# Patient Record
Sex: Female | Born: 1955 | Race: White | Hispanic: No | Marital: Married | Smoking: Never smoker
Health system: Southern US, Community
[De-identification: ages and names within clinical notes are randomized; demographics above are authoritative.]

## PROBLEM LIST (undated history)

## (undated) DIAGNOSIS — E7800 Pure hypercholesterolemia, unspecified: Secondary | ICD-10-CM

## (undated) DIAGNOSIS — L680 Hirsutism: Secondary | ICD-10-CM

## (undated) DIAGNOSIS — H269 Unspecified cataract: Secondary | ICD-10-CM

## (undated) DIAGNOSIS — D649 Anemia, unspecified: Secondary | ICD-10-CM

## (undated) DIAGNOSIS — M810 Age-related osteoporosis without current pathological fracture: Secondary | ICD-10-CM

## (undated) DIAGNOSIS — M1990 Unspecified osteoarthritis, unspecified site: Secondary | ICD-10-CM

## (undated) DIAGNOSIS — C9110 Chronic lymphocytic leukemia of B-cell type not having achieved remission: Secondary | ICD-10-CM

## (undated) DIAGNOSIS — E079 Disorder of thyroid, unspecified: Secondary | ICD-10-CM

## (undated) HISTORY — DX: Unspecified cataract: H26.9

## (undated) HISTORY — DX: Disorder of thyroid, unspecified: E07.9

## (undated) HISTORY — DX: Age-related osteoporosis without current pathological fracture: M81.0

## (undated) HISTORY — DX: Hirsutism: L68.0

## (undated) HISTORY — DX: Unspecified osteoarthritis, unspecified site: M19.90

## (undated) HISTORY — DX: Anemia, unspecified: D64.9

## (undated) HISTORY — DX: Chronic lymphocytic leukemia of B-cell type not having achieved remission: C91.10

## (undated) HISTORY — DX: Pure hypercholesterolemia, unspecified: E78.00

## (undated) HISTORY — PX: CATARACT EXTRACTION, BILATERAL: SHX1313

---
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
## 2003-09-13 ENCOUNTER — Other Ambulatory Visit: Admission: RE | Admit: 2003-09-13 | Discharge: 2003-09-13 | Payer: Self-pay | Admitting: Obstetrics & Gynecology

## 2004-04-04 ENCOUNTER — Encounter: Admission: RE | Admit: 2004-04-04 | Discharge: 2004-04-04 | Payer: Self-pay | Admitting: Obstetrics & Gynecology

## 2004-10-31 ENCOUNTER — Ambulatory Visit (HOSPITAL_COMMUNITY): Admission: RE | Admit: 2004-10-31 | Discharge: 2004-10-31 | Payer: Self-pay | Admitting: Gastroenterology

## 2005-09-18 ENCOUNTER — Ambulatory Visit: Payer: Self-pay | Admitting: Internal Medicine

## 2005-11-27 ENCOUNTER — Ambulatory Visit (HOSPITAL_COMMUNITY): Admission: RE | Admit: 2005-11-27 | Discharge: 2005-11-27 | Payer: Self-pay | Admitting: Cardiology

## 2013-02-03 ENCOUNTER — Encounter: Payer: Self-pay | Admitting: Obstetrics & Gynecology

## 2014-08-09 ENCOUNTER — Encounter: Payer: Self-pay | Admitting: Obstetrics & Gynecology

## 2014-11-26 ENCOUNTER — Other Ambulatory Visit: Payer: Self-pay | Admitting: Family Medicine

## 2014-11-26 ENCOUNTER — Ambulatory Visit
Admission: RE | Admit: 2014-11-26 | Discharge: 2014-11-26 | Disposition: A | Discharge status: Home / Self Care | Payer: Self-pay | Source: Ambulatory Visit | Attending: Family Medicine | Admitting: Family Medicine

## 2014-11-26 DIAGNOSIS — S139XXA Sprain of joints and ligaments of unspecified parts of neck, initial encounter: Secondary | ICD-10-CM

## 2015-10-16 ENCOUNTER — Encounter: Payer: Self-pay | Admitting: Obstetrics & Gynecology

## 2015-12-05 HISTORY — PX: BASAL CELL CARCINOMA EXCISION: SHX1214

## 2016-10-14 ENCOUNTER — Other Ambulatory Visit: Payer: Self-pay | Admitting: Obstetrics & Gynecology

## 2016-10-14 ENCOUNTER — Ambulatory Visit (INDEPENDENT_AMBULATORY_CARE_PROVIDER_SITE_OTHER): Payer: 59 | Admitting: Obstetrics & Gynecology

## 2016-10-14 ENCOUNTER — Encounter: Payer: Self-pay | Admitting: Obstetrics & Gynecology

## 2016-10-14 VITALS — BP 112/73 | Ht 60.0 in | Wt 103.0 lb

## 2016-10-14 DIAGNOSIS — Z01411 Encounter for gynecological examination (general) (routine) with abnormal findings: Secondary | ICD-10-CM | POA: Diagnosis not present

## 2016-10-14 DIAGNOSIS — Z780 Asymptomatic menopausal state: Secondary | ICD-10-CM

## 2016-10-14 DIAGNOSIS — Z1151 Encounter for screening for human papillomavirus (HPV): Secondary | ICD-10-CM | POA: Diagnosis not present

## 2016-10-14 DIAGNOSIS — N952 Postmenopausal atrophic vaginitis: Secondary | ICD-10-CM

## 2016-10-14 DIAGNOSIS — Z01419 Encounter for gynecological examination (general) (routine) without abnormal findings: Secondary | ICD-10-CM

## 2016-10-14 DIAGNOSIS — Z1231 Encounter for screening mammogram for malignant neoplasm of breast: Secondary | ICD-10-CM | POA: Diagnosis not present

## 2016-10-14 NOTE — Progress Notes (Signed)
Rebecca Aguirre [birth year] [id number]   History:    61 y.o.  for annual gyn exam, established patient G2P2 married.  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  Menopause.  No HRT.  Marital counseling with church.  Vaginal dryness and pain with IC, but declines Estradiol creams.  No pelvic pain.  No PMB.  Breasts wnl.  No SUI.  BMs wnl.  Fam MD for labs, but d/ced Crestor 6 mths ago and would like to come back here for a FLP.  Past medical history,surgical history, family history and social history were all reviewed and documented in the EPIC chart.  Gynecologic History No LMP recorded. Patient is postmenopausal. Contraception: post menopausal status Last Pap: 08/2014. Results were: normal/HPV HR neg Last mammogram: 10/2015. Results were: normal Last Dexa 02/2013 Osteopenia  Obstetric History OB History  Gravida Para Term Preterm AB Living            2  SAB TAB Ectopic Multiple Live Births                    ROS: A ROS was performed and pertinent positives and negatives are included in the history.  GENERAL: No fevers or chills. HEENT: No change in vision, no earache, sore throat or sinus congestion. NECK: No pain or stiffness. CARDIOVASCULAR: No chest pain or pressure. No palpitations. PULMONARY: No shortness of breath, cough or wheeze. GASTROINTESTINAL: No abdominal pain, nausea, vomiting or diarrhea, melena or bright red blood per rectum. GENITOURINARY: No urinary frequency, urgency, hesitancy or dysuria. MUSCULOSKELETAL: No joint or muscle pain, no back pain, no recent trauma. DERMATOLOGIC: No rash, no itching, no lesions. ENDOCRINE: No polyuria, polydipsia, no heat or cold intolerance. No recent change in weight. HEMATOLOGICAL: No anemia or easy bruising or bleeding. NEUROLOGIC: No headache, seizures, numbness, tingling or weakness. PSYCHIATRIC: No depression, no loss of interest in normal activity or change in sleep pattern.     Exam:   BP 112/73   Ht 5' (1.524 m)    Wt 103 lb (46.7 kg)   BMI 20.12 kg/m   Body mass index is 20.12 kg/m.  General appearance : Well developed well nourished female. No acute distress HEENT: Eyes: no retinal hemorrhage or exudates,  Neck supple, trachea midline, no carotid bruits, no thyroidmegaly Lungs: Clear to auscultation, no rhonchi or wheezes, or rib retractions  Heart: Regular rate and rhythm, no murmurs or gallops Breast:Examined in sitting and supine position were symmetrical in appearance, no palpable masses or tenderness,  no skin retraction, no nipple inversion, no nipple discharge, no skin discoloration, no axillary or supraclavicular lymphadenopathy Abdomen: no palpable masses or tenderness, no rebound or guarding Extremities: no edema or skin discoloration or tenderness  Pelvic:  Bartholin, Urethra, Skene Glands: Within normal limits             Vagina: No gross lesions or discharge  Cervix: No gross lesions or discharge.  Pap test done.  Uterus  AV, normal size, shape and consistency, non-tender and mobile  Adnexa  Without masses or tenderness  Anus and perineum  normal      Assessment/Plan:  61 y.o. female for annual exam.  1. Encounter for gynecological examination (general) (routine) with abnormal findings Pap test/HPV HR pending.  Screening Mammo scheduled today.  Will f/u here for Dexa.    2. Postmenopausal atrophic vaginitis Declines Estradiol cream.  Will use Replens PRN.  Kelly gel for IC.  3. Hypercholesterolemia D/Ced Crestor 6 mths ago.  Low cholesterol diet and physical activity.  F/U FLP.  Counseling >50% 15 min on above problems.   Princess Bruins MD, 11:06 AM 10/14/2016

## 2016-10-14 NOTE — Patient Instructions (Signed)
Annual exam/gyn exam with atrophic vaginitis.  Replens/KY gel.  Pap/HPV HR pending.  Will f/u for fasting Cholesterol panel.  Schedule Bone Density.

## 2016-10-14 NOTE — Addendum Note (Signed)
Addended by: Thamas Jaegers on: 10/14/2016 11:25 AM   Modules accepted: Orders

## 2016-10-16 LAB — PAP, TP IMAGING W/ HPV RNA, RFLX HPV TYPE 16,18/45: HPV mRNA, High Risk: NOT DETECTED

## 2016-10-17 ENCOUNTER — Encounter: Payer: Self-pay | Admitting: Obstetrics & Gynecology

## 2016-10-20 ENCOUNTER — Other Ambulatory Visit: Payer: Self-pay | Admitting: Gynecology

## 2016-10-20 ENCOUNTER — Encounter: Payer: Self-pay | Admitting: Gynecology

## 2016-10-20 ENCOUNTER — Ambulatory Visit (INDEPENDENT_AMBULATORY_CARE_PROVIDER_SITE_OTHER): Payer: 59

## 2016-10-20 ENCOUNTER — Telehealth: Payer: Self-pay | Admitting: Gynecology

## 2016-10-20 DIAGNOSIS — M810 Age-related osteoporosis without current pathological fracture: Secondary | ICD-10-CM

## 2016-10-20 DIAGNOSIS — Z780 Asymptomatic menopausal state: Secondary | ICD-10-CM

## 2016-10-20 HISTORY — DX: Age-related osteoporosis without current pathological fracture: M81.0

## 2016-10-20 NOTE — Telephone Encounter (Signed)
Agree, please schedule patient with me to discuss and manage Osteoporosis.

## 2016-10-20 NOTE — Telephone Encounter (Signed)
Tell patient her most recent bone density shows osteoporosis. Recommend office visit with Dr Lavoie to discuss treatment options 

## 2016-10-21 NOTE — Telephone Encounter (Signed)
Pt informed pt will call back to schedule OV.

## 2016-10-29 ENCOUNTER — Encounter: Payer: Self-pay | Admitting: Obstetrics & Gynecology

## 2016-10-29 ENCOUNTER — Telehealth: Payer: Self-pay

## 2016-10-29 ENCOUNTER — Ambulatory Visit (INDEPENDENT_AMBULATORY_CARE_PROVIDER_SITE_OTHER): Payer: 59 | Admitting: Obstetrics & Gynecology

## 2016-10-29 VITALS — BP 108/68

## 2016-10-29 DIAGNOSIS — M810 Age-related osteoporosis without current pathological fracture: Secondary | ICD-10-CM

## 2016-10-29 MED ORDER — ALENDRONATE SODIUM 70 MG PO TABS: 70.0000 mg | ORAL_TABLET | ORAL | 4 refills | Status: DC

## 2016-10-29 NOTE — Telephone Encounter (Signed)
Thanks, Alendronate (Fosamax) prescription sent.

## 2016-10-29 NOTE — Patient Instructions (Addendum)
Visit to discuss and manage your Bone Density results showing Osteoporosis.  The Bone Density showed the most severe bone loss at the Lt Femoral neck with a T score of -2.8 (Rt Femoral neck T score -2.5), other sites were in Osteopenia range.  After discussing Vit D and Ca++ supplementation as well at weight bearing physical activity, we decided to start on Alendronate (Fosamax generic) 70 mg PO every week to help maintain Bone Mass and decrease the long term risk of fracture which is currently increased by Osteoporosis.

## 2016-10-29 NOTE — Progress Notes (Signed)
    Rebecca Aguirre 1956-03-02 [id number]        61 y.o.  G2P2 married.  Menopause.  No HRT.  Consultation on management of BD result showing Osteroporosis  Personal h/o Osteopenia.  BD 2011 reviewed. No h/o fracture.  Had stopped Vit D and Ca++ supplements.  Physically active.  Never on Biphosphanates or other "Bone" treatment.  Mother on Prolia for Osteoporosis.    Past medical history,surgical history, problem list, medications, allergies, family history and social history were all reviewed and documented in the EPIC chart.  Directed ROS with pertinent positives and negatives documented in the history of present illness/assessment and plan.  Exam:  Vitals:   10/29/16 1032  BP: 108/68   General appearance:  Normal  BD result:  Osteoporosis at Femoral Neck Bilaterally.  Assessment/Plan:  61 y.o.   1. Age-related osteoporosis without current pathological fracture Osteoporosis.  T score -2.8 at Lt Femur neck (most severe location, Rt Femur neck T score -2.5), Osteopenia only at Spine.  The risk and benefits of Alendronate were discussed with the patient today to include a significant risk reduction for fractures. The patient was informed that stabilization of Ostoporosis, prevention of progression, not normalization of Bone Mass is the goal.  The most common side effects that have been reported include: Dizziness, and leg cramps although generally not severe enough to warrant discontinuation in the majority of patient on this medication. The small risk of jaw necrosis was discussed.  Alendronate (Fosamax generic) prescribed.  Counseling 100% x 25 min on above issue.   Princess Bruins MD, 11:05 AM 10/29/2016

## 2016-10-29 NOTE — Telephone Encounter (Signed)
Patient said she saw you today and was to call you back with information about where to send her generic Fosamax.  She wants it to go to [facility].  I have put this pharmacy on her chart.

## 2016-11-16 ENCOUNTER — Ambulatory Visit (INDEPENDENT_AMBULATORY_CARE_PROVIDER_SITE_OTHER): Payer: 59 | Admitting: Obstetrics & Gynecology

## 2016-11-16 ENCOUNTER — Encounter: Payer: Self-pay | Admitting: Obstetrics & Gynecology

## 2016-11-16 VITALS — BP 128/74

## 2016-11-16 DIAGNOSIS — N898 Other specified noninflammatory disorders of vagina: Secondary | ICD-10-CM | POA: Diagnosis not present

## 2016-11-16 LAB — WET PREP FOR TRICH, YEAST, CLUE
Clue Cells Wet Prep HPF POC: NONE SEEN
Trich, Wet Prep: NONE SEEN
Yeast Wet Prep HPF POC: NONE SEEN

## 2016-11-16 MED ORDER — TINIDAZOLE 500 MG PO TABS: 2.0000 g | ORAL_TABLET | Freq: Every day | ORAL | 0 refills | Status: AC

## 2016-11-16 NOTE — Patient Instructions (Signed)
1. Vaginal discharge Wet prep showing WBCs and Bacteria.  No Yeast, no Clue cell, no Trichomonas.  Decision to treat with Tindamax.  If no improvement will f/u to reassess. - WET PREP FOR TRICH, YEAST, CLUE  2. Vaginal odor See above. - WET PREP FOR [place], YEAST, CLUE  Please call me for reassessment if no improvement within a week.

## 2016-11-16 NOTE — Progress Notes (Signed)
    Rebecca Aguirre 01-Nov-1955 [id number]        61 y.o.  G2P2 married  RP:  Worsening vaginal d/c x 1-2 wks  Now vaginal d/c is yellowish with odor.  No PMB.  No pelvic pain.  No fever.  No vaginal itching.  Inflammation on Pap 10/2016, otherwise Pap normal, HPV HR neg.  Past medical history,surgical history, problem list, medications, allergies, family history and social history were all reviewed and documented in the EPIC chart.  Directed ROS with pertinent positives and negatives documented in the history of present illness/assessment and plan.  Exam:  Vitals:   11/16/16 1030  BP: 128/74   General appearance:  Normal  Gyn exam:  Vulva normal                     Speculum:  Cervix and vagina normal except for increased yellowish liquid d/c.  Wet prep done.  Assessment/Plan:  61 y.o. No obstetric history on file.   1. Vaginal discharge Wet prep showing WBCs and Bacteria.  No Yeast, no Clue cell, no Trichomonas.  Decision to treat with Tindamax.  If no improvement will f/u to reassess. - WET PREP FOR TRICH, YEAST, CLUE  2. Vaginal odor See above. - WET PREP FOR TRICH, YEAST, CLUE  Counseling on above issue >50% x 15 minutes.  Princess Bruins MD, 10:48 AM 11/16/2016

## 2016-12-08 DIAGNOSIS — H35372 Puckering of macula, left eye: Secondary | ICD-10-CM | POA: Diagnosis not present

## 2016-12-08 DIAGNOSIS — H01003 Unspecified blepharitis right eye, unspecified eyelid: Secondary | ICD-10-CM | POA: Diagnosis not present

## 2016-12-08 DIAGNOSIS — H25013 Cortical age-related cataract, bilateral: Secondary | ICD-10-CM | POA: Diagnosis not present

## 2017-01-14 DIAGNOSIS — L821 Other seborrheic keratosis: Secondary | ICD-10-CM | POA: Diagnosis not present

## 2017-01-14 DIAGNOSIS — D225 Melanocytic nevi of trunk: Secondary | ICD-10-CM | POA: Diagnosis not present

## 2017-01-14 DIAGNOSIS — L814 Other melanin hyperpigmentation: Secondary | ICD-10-CM | POA: Diagnosis not present

## 2017-05-13 DIAGNOSIS — M24571 Contracture, right ankle: Secondary | ICD-10-CM | POA: Diagnosis not present

## 2017-05-13 DIAGNOSIS — M7751 Other enthesopathy of right foot: Secondary | ICD-10-CM | POA: Diagnosis not present

## 2017-05-13 DIAGNOSIS — M79671 Pain in right foot: Secondary | ICD-10-CM | POA: Diagnosis not present

## 2017-06-02 DIAGNOSIS — M7751 Other enthesopathy of right foot: Secondary | ICD-10-CM | POA: Diagnosis not present

## 2017-06-02 DIAGNOSIS — M79671 Pain in right foot: Secondary | ICD-10-CM | POA: Diagnosis not present

## 2017-06-02 DIAGNOSIS — M24571 Contracture, right ankle: Secondary | ICD-10-CM | POA: Diagnosis not present

## 2017-10-19 ENCOUNTER — Encounter: Payer: 59 | Admitting: Obstetrics & Gynecology

## 2017-10-19 DIAGNOSIS — Z1231 Encounter for screening mammogram for malignant neoplasm of breast: Secondary | ICD-10-CM | POA: Diagnosis not present

## 2017-10-26 ENCOUNTER — Ambulatory Visit (INDEPENDENT_AMBULATORY_CARE_PROVIDER_SITE_OTHER): Payer: 59 | Admitting: Obstetrics & Gynecology

## 2017-10-26 ENCOUNTER — Encounter: Payer: Self-pay | Admitting: Obstetrics & Gynecology

## 2017-10-26 VITALS — BP 126/80 | Ht 60.0 in | Wt 104.0 lb

## 2017-10-26 DIAGNOSIS — N952 Postmenopausal atrophic vaginitis: Secondary | ICD-10-CM | POA: Diagnosis not present

## 2017-10-26 DIAGNOSIS — Z01411 Encounter for gynecological examination (general) (routine) with abnormal findings: Secondary | ICD-10-CM | POA: Diagnosis not present

## 2017-10-26 DIAGNOSIS — M810 Age-related osteoporosis without current pathological fracture: Secondary | ICD-10-CM | POA: Diagnosis not present

## 2017-10-26 DIAGNOSIS — Z780 Asymptomatic menopausal state: Secondary | ICD-10-CM

## 2017-10-26 LAB — CBC
HCT: 43.9 % (ref 35.0–45.0)
Hemoglobin: 15.2 g/dL (ref 11.7–15.5)
MCH: 30.2 pg (ref 27.0–33.0)
MCHC: 34.6 g/dL (ref 32.0–36.0)
MCV: 87.1 fL (ref 80.0–100.0)
MPV: 10.4 fL (ref 7.5–12.5)
PLATELETS: 227 10*3/uL (ref 140–400)
RBC: 5.04 10*6/uL (ref 3.80–5.10)
RDW: 13 % (ref 11.0–15.0)
WBC: 8.4 10*3/uL (ref 3.8–10.8)

## 2017-10-26 LAB — COMPREHENSIVE METABOLIC PANEL
AG Ratio: 2 (calc) (ref 1.0–2.5)
ALBUMIN MSPROF: 4.5 g/dL (ref 3.6–5.1)
ALT: 10 U/L (ref 6–29)
AST: 19 U/L (ref 10–35)
Alkaline phosphatase (APISO): 48 U/L (ref 33–130)
BUN: 13 mg/dL (ref 7–25)
CHLORIDE: 101 mmol/L (ref 98–110)
CO2: 30 mmol/L (ref 20–32)
CREATININE: 0.83 mg/dL (ref 0.50–0.99)
Calcium: 9.6 mg/dL (ref 8.6–10.4)
GLOBULIN: 2.2 g/dL (ref 1.9–3.7)
GLUCOSE: 90 mg/dL (ref 65–99)
Potassium: 3.8 mmol/L (ref 3.5–5.3)
Sodium: 140 mmol/L (ref 135–146)
Total Bilirubin: 0.8 mg/dL (ref 0.2–1.2)
Total Protein: 6.7 g/dL (ref 6.1–8.1)

## 2017-10-26 LAB — TSH: TSH: 2.31 mIU/L (ref 0.40–4.50)

## 2017-10-26 LAB — LIPID PANEL
CHOL/HDL RATIO: 4.7 (calc) (ref <5.0)
Cholesterol: 260 mg/dL — ABNORMAL HIGH (ref <200)
HDL: 55 mg/dL (ref >50)
LDL Cholesterol (Calc): 175 mg/dL (calc) — ABNORMAL HIGH
NON-HDL CHOLESTEROL (CALC): 205 mg/dL — AB (ref <130)
TRIGLYCERIDES: 157 mg/dL — AB (ref <150)

## 2017-10-26 NOTE — Progress Notes (Signed)
Rebecca Aguirre [birth year] [id number]   History:    62 y.o. G2P2L2 Married.    RP:  Established patient presenting for annual gyn exam   HPI: Menopause, well without hormone replacement therapy.  No postmenopausal bleeding.  Has not been sexually active lately because of pain at the entrance of the vagina on the left side.  Has not used the Premarin cream prescribed.  Would prefer bioidentical estrogen.  No pain when not sexually active.  No vaginal discharge.  Urine and bowel movements normal.  Breasts normal.  Fasting Health labs here today.  Body mass index 20.31.  Physically active regularly.  Past medical history,surgical history, family history and social history were all reviewed and documented in the EPIC chart.  Gynecologic History No LMP recorded. Patient is postmenopausal. Contraception: post menopausal status Last Pap: 10/2016. Results were: Negative Last mammogram: 10/2017. Results were: Negative Bone Density: 10/2016.  Osteoporosis at the left femoral neck with a T score of -2.8.  Declines treatment at this time. Colonoscopy: 10 yrs ago, will schedule this year.  Obstetric History OB History  Gravida Para Term Preterm AB Living  2       0 2  SAB TAB Ectopic Multiple Live Births      0        # Outcome Date GA Lbr Len/2nd Weight Sex Delivery Anes PTL Lv  2 Gravida           1 Gravida              ROS: A ROS was performed and pertinent positives and negatives are included in the history.  GENERAL: No fevers or chills. HEENT: No change in vision, no earache, sore throat or sinus congestion. NECK: No pain or stiffness. CARDIOVASCULAR: No chest pain or pressure. No palpitations. PULMONARY: No shortness of breath, cough or wheeze. GASTROINTESTINAL: No abdominal pain, nausea, vomiting or diarrhea, melena or bright red blood per rectum. GENITOURINARY: No urinary frequency, urgency, hesitancy or dysuria. MUSCULOSKELETAL: No joint or muscle pain, no back pain, no recent trauma.  DERMATOLOGIC: No rash, no itching, no lesions. ENDOCRINE: No polyuria, polydipsia, no heat or cold intolerance. No recent change in weight. HEMATOLOGICAL: No anemia or easy bruising or bleeding. NEUROLOGIC: No headache, seizures, numbness, tingling or weakness. PSYCHIATRIC: No depression, no loss of interest in normal activity or change in sleep pattern.     Exam:   BP 126/80 (BP Location: Right Arm, Patient Position: Sitting, Cuff Size: Normal)   Ht 5' (1.524 m)   Wt 104 lb (47.2 kg)   BMI 20.31 kg/m   Body mass index is 20.31 kg/m.  General appearance : Well developed well nourished female. No acute distress HEENT: Eyes: no retinal hemorrhage or exudates,  Neck supple, trachea midline, no carotid bruits, no thyroidmegaly Lungs: Clear to auscultation, no rhonchi or wheezes, or rib retractions  Heart: Regular rate and rhythm, no murmurs or gallops Breast:Examined in sitting and supine position were symmetrical in appearance, no palpable masses or tenderness,  no skin retraction, no nipple inversion, no nipple discharge, no skin discoloration, no axillary or supraclavicular lymphadenopathy Abdomen: no palpable masses or tenderness, no rebound or guarding Extremities: no edema or skin discoloration or tenderness  Pelvic: Vulva: Atrophic vaginitis of menopause.  No discrete lesion.             Vagina: No gross lesions or discharge  Cervix: No gross lesions or discharge  Uterus  AV, normal size, shape and consistency, non-tender and  mobile  Adnexa  Without masses or tenderness  Anus: Normal   Assessment/Plan:  62 y.o. female for annual exam   1. Encounter for gynecological examination with abnormal finding Gynecologic exam with postmenopausal atrophic vaginitis.  Pap test negative April 2018.  Breast exam normal.  Screening mammogram -April 2019.  Will schedule colonoscopy this year.  Health labs here today. - CBC - Comp Met (CMET) - Lipid panel - TSH - VITAMIN D 25 Hydroxy (Vit-D  Deficiency, Fractures)  2. Menopause present Well on no hormone replacement therapy.  No postmenopausal bleeding.  3. Post-menopausal atrophic vaginitis Postmenopausal atrophic vaginitis with superficial dyspareunia.  Recommended Estrace cream.  Patient prefers to use coconut oil at this time and will call back if needs the estradiol cream treatment.  4. Age-related osteoporosis without current pathological fracture Osteoporosis with a T score of -2.8.  Patient declined treatment after counseling.  Patient on vitamin D supplements, calcium rich nutrition and regular weightbearing physical activity.  Will repeat bone density in April 2020.  Other orders - Multiple Vitamin (MULTIVITAMIN) capsule; Take 1 capsule by mouth. occasional - Cholecalciferol (VITAMIN D PO); Take by mouth. occasional - CALCIUM PO; Take by mouth. occasional  Princess Bruins MD, 12:55 PM 10/26/2017

## 2017-10-27 ENCOUNTER — Encounter: Payer: Self-pay | Admitting: Obstetrics & Gynecology

## 2017-10-27 LAB — VITAMIN D 25 HYDROXY (VIT D DEFICIENCY, FRACTURES): Vit D, 25-Hydroxy: 17 ng/mL — ABNORMAL LOW (ref 30–100)

## 2017-10-27 NOTE — Patient Instructions (Signed)
1. Encounter for gynecological examination with abnormal finding Gynecologic exam with postmenopausal atrophic vaginitis.  Pap test negative April 2018.  Breast exam normal.  Screening mammogram -April 2019.  Will schedule colonoscopy this year.  Health labs here today. - CBC - Comp Met (CMET) - Lipid panel - TSH - VITAMIN D 25 Hydroxy (Vit-D Deficiency, Fractures)  2. Menopause present Well on no hormone replacement therapy.  No postmenopausal bleeding.  3. Post-menopausal atrophic vaginitis Postmenopausal atrophic vaginitis with superficial dyspareunia.  Recommended Estrace cream.  Patient prefers to use coconut oil at this time and will call back if needs the estradiol cream treatment.  4. Age-related osteoporosis without current pathological fracture Osteoporosis with a T score of -2.8.  Patient declined treatment after counseling.  Patient on vitamin D supplements, calcium rich nutrition and regular weightbearing physical activity.  Will repeat bone density in April 2020.  Other orders - Multiple Vitamin (MULTIVITAMIN) capsule; Take 1 capsule by mouth. occasional - Cholecalciferol (VITAMIN D PO); Take by mouth. occasional - CALCIUM PO; Take by mouth. occasional  Joycelyn Schmid, good seeing you today!

## 2017-10-28 ENCOUNTER — Other Ambulatory Visit: Payer: Self-pay | Admitting: Obstetrics & Gynecology

## 2017-10-28 DIAGNOSIS — E559 Vitamin D deficiency, unspecified: Secondary | ICD-10-CM

## 2017-10-28 MED ORDER — VITAMIN D (ERGOCALCIFEROL) 1.25 MG (50000 UNIT) PO CAPS: 50000.0000 [IU] | ORAL_CAPSULE | ORAL | 0 refills | Status: DC

## 2017-10-29 ENCOUNTER — Telehealth: Payer: Self-pay | Admitting: *Deleted

## 2017-10-29 NOTE — Telephone Encounter (Signed)
Patient called asking how much calcium she should be taking daily? Please advise

## 2017-11-01 NOTE — Telephone Encounter (Signed)
I don't recommend Ca++ supplements, rather I would like her to eat Ca++ rich foods, like milk, cheese...  If she cannot, than Ca++ Supplement 500 or 550 mg per day would be safe.

## 2017-11-01 NOTE — Telephone Encounter (Signed)
Pt informed with the below note. 

## 2017-11-02 ENCOUNTER — Encounter: Payer: Self-pay | Admitting: Anesthesiology

## 2018-01-31 ENCOUNTER — Other Ambulatory Visit: Payer: 59

## 2018-02-01 ENCOUNTER — Other Ambulatory Visit: Payer: 59

## 2018-02-01 DIAGNOSIS — E559 Vitamin D deficiency, unspecified: Secondary | ICD-10-CM

## 2018-02-02 LAB — VITAMIN D 25 HYDROXY (VIT D DEFICIENCY, FRACTURES): Vit D, 25-Hydroxy: 31 ng/mL (ref 30–100)

## 2018-02-11 DIAGNOSIS — H01003 Unspecified blepharitis right eye, unspecified eyelid: Secondary | ICD-10-CM | POA: Diagnosis not present

## 2018-02-11 DIAGNOSIS — H25013 Cortical age-related cataract, bilateral: Secondary | ICD-10-CM | POA: Diagnosis not present

## 2018-02-11 DIAGNOSIS — H35372 Puckering of macula, left eye: Secondary | ICD-10-CM | POA: Diagnosis not present

## 2018-03-15 DIAGNOSIS — H6991 Unspecified Eustachian tube disorder, right ear: Secondary | ICD-10-CM | POA: Diagnosis not present

## 2018-03-15 DIAGNOSIS — L02411 Cutaneous abscess of right axilla: Secondary | ICD-10-CM | POA: Diagnosis not present

## 2018-03-23 DIAGNOSIS — R2230 Localized swelling, mass and lump, unspecified upper limb: Secondary | ICD-10-CM | POA: Diagnosis not present

## 2018-03-29 DIAGNOSIS — L720 Epidermal cyst: Secondary | ICD-10-CM | POA: Diagnosis not present

## 2018-03-29 DIAGNOSIS — L814 Other melanin hyperpigmentation: Secondary | ICD-10-CM | POA: Diagnosis not present

## 2018-03-29 DIAGNOSIS — D225 Melanocytic nevi of trunk: Secondary | ICD-10-CM | POA: Diagnosis not present

## 2018-09-27 DIAGNOSIS — R319 Hematuria, unspecified: Secondary | ICD-10-CM | POA: Diagnosis not present

## 2018-09-27 DIAGNOSIS — R1011 Right upper quadrant pain: Secondary | ICD-10-CM | POA: Diagnosis not present

## 2018-09-30 ENCOUNTER — Ambulatory Visit
Admission: RE | Admit: 2018-09-30 | Discharge: 2018-09-30 | Disposition: A | Discharge status: Home / Self Care | Payer: 59 | Source: Ambulatory Visit | Attending: Family Medicine | Admitting: Family Medicine

## 2018-09-30 ENCOUNTER — Other Ambulatory Visit: Payer: Self-pay | Admitting: Family Medicine

## 2018-09-30 DIAGNOSIS — R1011 Right upper quadrant pain: Secondary | ICD-10-CM

## 2018-09-30 DIAGNOSIS — R110 Nausea: Secondary | ICD-10-CM

## 2018-10-04 ENCOUNTER — Other Ambulatory Visit: Payer: Self-pay | Admitting: Family Medicine

## 2018-10-04 DIAGNOSIS — R109 Unspecified abdominal pain: Secondary | ICD-10-CM

## 2018-10-04 DIAGNOSIS — K769 Liver disease, unspecified: Secondary | ICD-10-CM

## 2018-10-19 ENCOUNTER — Other Ambulatory Visit: Payer: Self-pay

## 2018-10-19 ENCOUNTER — Ambulatory Visit
Admission: RE | Admit: 2018-10-19 | Discharge: 2018-10-19 | Disposition: A | Discharge status: Home / Self Care | Payer: 59 | Source: Ambulatory Visit | Attending: Family Medicine | Admitting: Family Medicine

## 2018-10-19 DIAGNOSIS — R109 Unspecified abdominal pain: Secondary | ICD-10-CM | POA: Diagnosis not present

## 2018-10-19 DIAGNOSIS — K769 Liver disease, unspecified: Secondary | ICD-10-CM

## 2018-10-28 ENCOUNTER — Encounter: Payer: 59 | Admitting: Obstetrics & Gynecology

## 2018-12-12 ENCOUNTER — Encounter: Payer: Self-pay | Admitting: Obstetrics & Gynecology

## 2019-04-04 ENCOUNTER — Encounter: Payer: Self-pay | Admitting: Gynecology

## 2019-08-31 DIAGNOSIS — Z23 Encounter for immunization: Secondary | ICD-10-CM | POA: Diagnosis not present

## 2019-09-28 DIAGNOSIS — Z23 Encounter for immunization: Secondary | ICD-10-CM | POA: Diagnosis not present

## 2019-10-18 ENCOUNTER — Other Ambulatory Visit: Payer: Self-pay | Admitting: Family Medicine

## 2019-10-18 DIAGNOSIS — K769 Liver disease, unspecified: Secondary | ICD-10-CM

## 2019-10-23 ENCOUNTER — Other Ambulatory Visit: Payer: 59

## 2019-10-31 ENCOUNTER — Ambulatory Visit
Admission: RE | Admit: 2019-10-31 | Discharge: 2019-10-31 | Disposition: A | Discharge status: Home / Self Care | Payer: Medicare HMO | Source: Ambulatory Visit | Attending: Family Medicine | Admitting: Family Medicine

## 2019-10-31 DIAGNOSIS — K769 Liver disease, unspecified: Secondary | ICD-10-CM | POA: Diagnosis not present

## 2019-11-07 DIAGNOSIS — H9312 Tinnitus, left ear: Secondary | ICD-10-CM | POA: Diagnosis not present

## 2019-11-07 DIAGNOSIS — E7800 Pure hypercholesterolemia, unspecified: Secondary | ICD-10-CM | POA: Diagnosis not present

## 2019-11-07 DIAGNOSIS — E559 Vitamin D deficiency, unspecified: Secondary | ICD-10-CM | POA: Diagnosis not present

## 2019-11-07 DIAGNOSIS — R252 Cramp and spasm: Secondary | ICD-10-CM | POA: Diagnosis not present

## 2019-12-18 ENCOUNTER — Encounter: Payer: Self-pay | Admitting: Obstetrics & Gynecology

## 2019-12-18 DIAGNOSIS — Z1231 Encounter for screening mammogram for malignant neoplasm of breast: Secondary | ICD-10-CM | POA: Diagnosis not present

## 2020-01-22 DIAGNOSIS — Z23 Encounter for immunization: Secondary | ICD-10-CM | POA: Diagnosis not present

## 2020-01-22 DIAGNOSIS — Z1159 Encounter for screening for other viral diseases: Secondary | ICD-10-CM | POA: Diagnosis not present

## 2020-01-22 DIAGNOSIS — E559 Vitamin D deficiency, unspecified: Secondary | ICD-10-CM | POA: Diagnosis not present

## 2020-01-22 DIAGNOSIS — I700 Atherosclerosis of aorta: Secondary | ICD-10-CM | POA: Diagnosis not present

## 2020-01-22 DIAGNOSIS — Z1211 Encounter for screening for malignant neoplasm of colon: Secondary | ICD-10-CM | POA: Diagnosis not present

## 2020-01-22 DIAGNOSIS — M810 Age-related osteoporosis without current pathological fracture: Secondary | ICD-10-CM | POA: Diagnosis not present

## 2020-01-22 DIAGNOSIS — R7309 Other abnormal glucose: Secondary | ICD-10-CM | POA: Diagnosis not present

## 2020-01-22 DIAGNOSIS — Z0000 Encounter for general adult medical examination without abnormal findings: Secondary | ICD-10-CM | POA: Diagnosis not present

## 2020-01-22 DIAGNOSIS — E7800 Pure hypercholesterolemia, unspecified: Secondary | ICD-10-CM | POA: Diagnosis not present

## 2020-01-23 DIAGNOSIS — Z1211 Encounter for screening for malignant neoplasm of colon: Secondary | ICD-10-CM | POA: Diagnosis not present

## 2020-04-16 DIAGNOSIS — Z23 Encounter for immunization: Secondary | ICD-10-CM | POA: Diagnosis not present

## 2020-04-17 ENCOUNTER — Ambulatory Visit: Payer: Medicare HMO | Admitting: Obstetrics & Gynecology

## 2020-04-17 ENCOUNTER — Encounter: Payer: Self-pay | Admitting: Obstetrics & Gynecology

## 2020-04-17 ENCOUNTER — Other Ambulatory Visit: Payer: Self-pay

## 2020-04-17 VITALS — BP 120/78 | Ht 59.75 in | Wt 101.0 lb

## 2020-04-17 DIAGNOSIS — Z01419 Encounter for gynecological examination (general) (routine) without abnormal findings: Secondary | ICD-10-CM

## 2020-04-17 DIAGNOSIS — Z780 Asymptomatic menopausal state: Secondary | ICD-10-CM

## 2020-04-17 DIAGNOSIS — M810 Age-related osteoporosis without current pathological fracture: Secondary | ICD-10-CM

## 2020-04-17 NOTE — Progress Notes (Signed)
Rebecca Aguirre 1955-11-06 [id number]   History:    64 y.o. Rebecca Aguirre Married.  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  RP:  Established patient presenting for annual gyn exam   HPI: Menopause, well without hormone replacement therapy.  No postmenopausal bleeding.  No pelvic pain.  Abstinent.  No vaginal discharge.  Urine and bowel movements normal.  Breasts normal. Body mass index 19.89.  Physically active regularly.  Fasting Health labs with Fam MD.  BD Osteoporosis in 2018.  [place] 2015.   Past medical history,surgical history, family history and social history were all reviewed and documented in the EPIC chart.  Gynecologic History No LMP recorded. Patient is postmenopausal.  Obstetric History OB History  Gravida Para Term Preterm AB Living  2       0 2  SAB TAB Ectopic Multiple Live Births      0        # Outcome Date GA Lbr Len/2nd Weight Sex Delivery Anes PTL Lv  2 Gravida           1 Gravida              ROS: A ROS was performed and pertinent positives and negatives are included in the history.  GENERAL: No fevers or chills. HEENT: No change in vision, no earache, sore throat or sinus congestion. NECK: No pain or stiffness. CARDIOVASCULAR: No chest pain or pressure. No palpitations. PULMONARY: No shortness of breath, cough or wheeze. GASTROINTESTINAL: No abdominal pain, nausea, vomiting or diarrhea, melena or bright red blood per rectum. GENITOURINARY: No urinary frequency, urgency, hesitancy or dysuria. MUSCULOSKELETAL: No joint or muscle pain, no back pain, no recent trauma. DERMATOLOGIC: No rash, no itching, no lesions. ENDOCRINE: No polyuria, polydipsia, no heat or cold intolerance. No recent change in weight. HEMATOLOGICAL: No anemia or easy bruising or bleeding. NEUROLOGIC: No headache, seizures, numbness, tingling or weakness. PSYCHIATRIC: No depression, no loss of interest in normal activity or change in sleep pattern.     Exam:   BP 120/78   Ht 4' 11.75" (1.518  m)   Wt 101 lb (45.8 kg)   BMI 19.89 kg/m   Body mass index is 19.89 kg/m.  General appearance : Well developed well nourished female. No acute distress HEENT: Eyes: no retinal hemorrhage or exudates,  Neck supple, trachea midline, no carotid bruits, no thyroidmegaly Lungs: Clear to auscultation, no rhonchi or wheezes, or rib retractions  Heart: Regular rate and rhythm, no murmurs or gallops Breast:Examined in sitting and supine position were symmetrical in appearance, no palpable masses or tenderness,  no skin retraction, no nipple inversion, no nipple discharge, no skin discoloration, no axillary or supraclavicular lymphadenopathy Abdomen: no palpable masses or tenderness, no rebound or guarding Extremities: no edema or skin discoloration or tenderness  Pelvic: Vulva: Normal             Vagina: No gross lesions or discharge  Cervix: No gross lesions or discharge.  Pap reflex done.  Uterus AV, normal size, shape and consistency, non-tender and mobile  Adnexa  Without masses or tenderness  Anus: Normal   Assessment/Plan:  64 y.o. female for annual exam   1. Encounter for routine gynecological examination with Papanicolaou smear of cervix Normal gynecologic exam.  Pap reflex done.  Breast exam normal.  Screening mammogram June 2021 was negative.  Colonoscopy 2015.  Body mass index 19.89.  Continue with healthy nutrition and increase fitness.  Health labs with family physician.  2. Postmenopause Well  on no hormone replacement therapy.  No postmenopausal bleeding.  3. Age-related osteoporosis without current pathological fracture Osteoporosis on bone density in April 2018.  Not on any bone medication.  We will repeat a bone density now.  Vitamin D supplements, calcium intake of 1500 mg daily and regular weightbearing physical activity is recommended. - DG Bone Density; Future  Other orders - rosuvastatin (CRESTOR) 10 MG tablet; Take 10 mg by mouth daily. - MAGNESIUM GLUCONATE PO;  Take 400 mg by mouth.  Princess Bruins MD, 12:03 PM 04/17/2020

## 2020-04-18 LAB — PAP IG W/ RFLX HPV ASCU

## 2020-04-21 ENCOUNTER — Encounter: Payer: Self-pay | Admitting: Obstetrics & Gynecology

## 2020-04-23 DIAGNOSIS — H35372 Puckering of macula, left eye: Secondary | ICD-10-CM | POA: Diagnosis not present

## 2020-04-23 DIAGNOSIS — H04123 Dry eye syndrome of bilateral lacrimal glands: Secondary | ICD-10-CM | POA: Diagnosis not present

## 2020-04-23 DIAGNOSIS — H524 Presbyopia: Secondary | ICD-10-CM | POA: Diagnosis not present

## 2020-04-23 DIAGNOSIS — H25013 Cortical age-related cataract, bilateral: Secondary | ICD-10-CM | POA: Diagnosis not present

## 2020-04-23 DIAGNOSIS — H2513 Age-related nuclear cataract, bilateral: Secondary | ICD-10-CM | POA: Diagnosis not present

## 2020-06-05 ENCOUNTER — Other Ambulatory Visit: Payer: Self-pay

## 2020-06-05 ENCOUNTER — Other Ambulatory Visit: Payer: Self-pay | Admitting: Obstetrics & Gynecology

## 2020-06-05 ENCOUNTER — Ambulatory Visit (INDEPENDENT_AMBULATORY_CARE_PROVIDER_SITE_OTHER): Payer: Medicare HMO

## 2020-06-05 DIAGNOSIS — Z780 Asymptomatic menopausal state: Secondary | ICD-10-CM | POA: Diagnosis not present

## 2020-06-05 DIAGNOSIS — M810 Age-related osteoporosis without current pathological fracture: Secondary | ICD-10-CM

## 2020-06-05 DIAGNOSIS — H524 Presbyopia: Secondary | ICD-10-CM | POA: Diagnosis not present

## 2020-06-05 DIAGNOSIS — H5213 Myopia, bilateral: Secondary | ICD-10-CM | POA: Diagnosis not present

## 2020-06-13 DIAGNOSIS — Z23 Encounter for immunization: Secondary | ICD-10-CM | POA: Diagnosis not present

## 2020-12-23 ENCOUNTER — Encounter: Payer: Self-pay | Admitting: Anesthesiology

## 2020-12-23 DIAGNOSIS — Z1231 Encounter for screening mammogram for malignant neoplasm of breast: Secondary | ICD-10-CM | POA: Diagnosis not present

## 2020-12-31 DIAGNOSIS — R922 Inconclusive mammogram: Secondary | ICD-10-CM | POA: Diagnosis not present

## 2020-12-31 DIAGNOSIS — R928 Other abnormal and inconclusive findings on diagnostic imaging of breast: Secondary | ICD-10-CM | POA: Diagnosis not present

## 2020-12-31 DIAGNOSIS — R590 Localized enlarged lymph nodes: Secondary | ICD-10-CM | POA: Diagnosis not present

## 2021-01-03 ENCOUNTER — Encounter: Payer: Self-pay | Admitting: Obstetrics & Gynecology

## 2021-01-13 DIAGNOSIS — D1801 Hemangioma of skin and subcutaneous tissue: Secondary | ICD-10-CM | POA: Diagnosis not present

## 2021-01-13 DIAGNOSIS — L814 Other melanin hyperpigmentation: Secondary | ICD-10-CM | POA: Diagnosis not present

## 2021-01-13 DIAGNOSIS — L821 Other seborrheic keratosis: Secondary | ICD-10-CM | POA: Diagnosis not present

## 2021-01-13 DIAGNOSIS — Z872 Personal history of diseases of the skin and subcutaneous tissue: Secondary | ICD-10-CM | POA: Diagnosis not present

## 2021-01-13 DIAGNOSIS — Z85828 Personal history of other malignant neoplasm of skin: Secondary | ICD-10-CM | POA: Diagnosis not present

## 2021-01-13 DIAGNOSIS — D225 Melanocytic nevi of trunk: Secondary | ICD-10-CM | POA: Diagnosis not present

## 2021-01-13 DIAGNOSIS — L905 Scar conditions and fibrosis of skin: Secondary | ICD-10-CM | POA: Diagnosis not present

## 2021-02-26 DIAGNOSIS — G44209 Tension-type headache, unspecified, not intractable: Secondary | ICD-10-CM | POA: Diagnosis not present

## 2021-04-14 DIAGNOSIS — R7303 Prediabetes: Secondary | ICD-10-CM | POA: Diagnosis not present

## 2021-04-14 DIAGNOSIS — Z1389 Encounter for screening for other disorder: Secondary | ICD-10-CM | POA: Diagnosis not present

## 2021-04-14 DIAGNOSIS — E559 Vitamin D deficiency, unspecified: Secondary | ICD-10-CM | POA: Diagnosis not present

## 2021-04-14 DIAGNOSIS — E7800 Pure hypercholesterolemia, unspecified: Secondary | ICD-10-CM | POA: Diagnosis not present

## 2021-04-14 DIAGNOSIS — Z0000 Encounter for general adult medical examination without abnormal findings: Secondary | ICD-10-CM | POA: Diagnosis not present

## 2021-04-14 DIAGNOSIS — I700 Atherosclerosis of aorta: Secondary | ICD-10-CM | POA: Diagnosis not present

## 2021-04-14 DIAGNOSIS — M810 Age-related osteoporosis without current pathological fracture: Secondary | ICD-10-CM | POA: Diagnosis not present

## 2021-04-14 DIAGNOSIS — Z1211 Encounter for screening for malignant neoplasm of colon: Secondary | ICD-10-CM | POA: Diagnosis not present

## 2021-05-12 DIAGNOSIS — Z23 Encounter for immunization: Secondary | ICD-10-CM | POA: Diagnosis not present

## 2021-05-12 DIAGNOSIS — H9202 Otalgia, left ear: Secondary | ICD-10-CM | POA: Diagnosis not present

## 2021-05-12 DIAGNOSIS — H9312 Tinnitus, left ear: Secondary | ICD-10-CM | POA: Diagnosis not present

## 2021-05-12 DIAGNOSIS — Z1211 Encounter for screening for malignant neoplasm of colon: Secondary | ICD-10-CM | POA: Diagnosis not present

## 2021-07-03 DIAGNOSIS — R922 Inconclusive mammogram: Secondary | ICD-10-CM | POA: Diagnosis not present

## 2021-07-03 DIAGNOSIS — R590 Localized enlarged lymph nodes: Secondary | ICD-10-CM | POA: Diagnosis not present

## 2021-07-08 ENCOUNTER — Encounter: Payer: Self-pay | Admitting: Obstetrics & Gynecology

## 2021-08-22 ENCOUNTER — Other Ambulatory Visit (HOSPITAL_COMMUNITY)
Admission: RE | Admit: 2021-08-22 | Discharge: 2021-08-22 | Disposition: A | Discharge status: Home / Self Care | Payer: Medicare HMO | Source: Ambulatory Visit | Attending: Obstetrics & Gynecology | Admitting: Obstetrics & Gynecology

## 2021-08-22 ENCOUNTER — Encounter: Payer: Self-pay | Admitting: Obstetrics & Gynecology

## 2021-08-22 DIAGNOSIS — C9110 Chronic lymphocytic leukemia of B-cell type not having achieved remission: Secondary | ICD-10-CM | POA: Diagnosis not present

## 2021-08-22 DIAGNOSIS — R590 Localized enlarged lymph nodes: Secondary | ICD-10-CM | POA: Diagnosis not present

## 2021-08-26 LAB — SURGICAL PATHOLOGY

## 2021-09-01 ENCOUNTER — Telehealth: Payer: Self-pay | Admitting: Hematology

## 2021-09-01 NOTE — Telephone Encounter (Signed)
Scheduled appt per 2/22 referral. Pt is aware of appt date and time. Pt is aware to arrive 15 mins prior to appt time and to bring and updated insurance card. Pt is aware of appt location.   

## 2021-09-02 ENCOUNTER — Other Ambulatory Visit: Payer: Self-pay | Admitting: Family Medicine

## 2021-09-02 DIAGNOSIS — G44209 Tension-type headache, unspecified, not intractable: Secondary | ICD-10-CM | POA: Diagnosis not present

## 2021-09-02 DIAGNOSIS — M542 Cervicalgia: Secondary | ICD-10-CM

## 2021-09-02 DIAGNOSIS — C9110 Chronic lymphocytic leukemia of B-cell type not having achieved remission: Secondary | ICD-10-CM | POA: Diagnosis not present

## 2021-09-14 ENCOUNTER — Ambulatory Visit
Admission: RE | Admit: 2021-09-14 | Discharge: 2021-09-14 | Disposition: A | Discharge status: Home / Self Care | Payer: Self-pay | Source: Ambulatory Visit | Attending: Family Medicine | Admitting: Family Medicine

## 2021-09-14 ENCOUNTER — Other Ambulatory Visit: Payer: Self-pay

## 2021-09-14 DIAGNOSIS — M542 Cervicalgia: Secondary | ICD-10-CM

## 2021-09-22 ENCOUNTER — Inpatient Hospital Stay: Payer: Medicare Other

## 2021-09-22 ENCOUNTER — Other Ambulatory Visit: Payer: Self-pay

## 2021-09-22 ENCOUNTER — Inpatient Hospital Stay: Payer: Medicare Other | Attending: Hematology | Admitting: Hematology

## 2021-09-22 VITALS — BP 112/70 | HR 88 | Temp 98.1°F | Resp 16 | Ht 59.75 in | Wt 101.3 lb

## 2021-09-22 DIAGNOSIS — C9110 Chronic lymphocytic leukemia of B-cell type not having achieved remission: Secondary | ICD-10-CM

## 2021-09-22 DIAGNOSIS — E785 Hyperlipidemia, unspecified: Secondary | ICD-10-CM | POA: Insufficient documentation

## 2021-09-22 DIAGNOSIS — Z807 Family history of other malignant neoplasms of lymphoid, hematopoietic and related tissues: Secondary | ICD-10-CM | POA: Insufficient documentation

## 2021-09-22 DIAGNOSIS — D696 Thrombocytopenia, unspecified: Secondary | ICD-10-CM | POA: Insufficient documentation

## 2021-09-22 DIAGNOSIS — D860 Sarcoidosis of lung: Secondary | ICD-10-CM | POA: Diagnosis not present

## 2021-09-22 DIAGNOSIS — M810 Age-related osteoporosis without current pathological fracture: Secondary | ICD-10-CM | POA: Diagnosis not present

## 2021-09-22 LAB — CBC WITH DIFFERENTIAL/PLATELET
Abs Immature Granulocytes: 0.02 10*3/uL (ref 0.00–0.07)
Basophils Absolute: 0 10*3/uL (ref 0.0–0.1)
Basophils Relative: 0 %
Eosinophils Absolute: 0.2 10*3/uL (ref 0.0–0.5)
Eosinophils Relative: 2 %
HCT: 39 % (ref 36.0–46.0)
Hemoglobin: 13.1 g/dL (ref 12.0–15.0)
Immature Granulocytes: 0 %
Lymphocytes Relative: 58 %
Lymphs Abs: 5.6 10*3/uL — ABNORMAL HIGH (ref 0.7–4.0)
MCH: 30.8 pg (ref 26.0–34.0)
MCHC: 33.6 g/dL (ref 30.0–36.0)
MCV: 91.8 fL (ref 80.0–100.0)
Monocytes Absolute: 0.6 10*3/uL (ref 0.1–1.0)
Monocytes Relative: 6 %
Neutro Abs: 3.2 10*3/uL (ref 1.7–7.7)
Neutrophils Relative %: 34 %
Platelets: 141 10*3/uL — ABNORMAL LOW (ref 150–400)
RBC: 4.25 MIL/uL (ref 3.87–5.11)
RDW: 13.6 % (ref 11.5–15.5)
Smear Review: NORMAL
WBC: 9.6 10*3/uL (ref 4.0–10.5)
nRBC: 0 % (ref 0.0–0.2)

## 2021-09-22 LAB — HEPATITIS C ANTIBODY: HCV Ab: NONREACTIVE

## 2021-09-22 LAB — CMP (CANCER CENTER ONLY)
ALT: 19 U/L (ref 0–44)
AST: 32 U/L (ref 15–41)
Albumin: 4.2 g/dL (ref 3.5–5.0)
Alkaline Phosphatase: 54 U/L (ref 38–126)
Anion gap: 7 (ref 5–15)
BUN: 15 mg/dL (ref 8–23)
CO2: 30 mmol/L (ref 22–32)
Calcium: 9.5 mg/dL (ref 8.9–10.3)
Chloride: 102 mmol/L (ref 98–111)
Creatinine: 0.85 mg/dL (ref 0.44–1.00)
GFR, Estimated: >60 mL/min (ref >60)
Glucose, Bld: 101 mg/dL — ABNORMAL HIGH (ref 70–99)
Potassium: 3.9 mmol/L (ref 3.5–5.1)
Sodium: 139 mmol/L (ref 135–145)
Total Bilirubin: 0.4 mg/dL (ref 0.3–1.2)
Total Protein: 7.4 g/dL (ref 6.5–8.1)

## 2021-09-22 LAB — HEPATITIS B SURFACE ANTIGEN: Hepatitis B Surface Ag: NONREACTIVE

## 2021-09-22 LAB — HEPATITIS B CORE ANTIBODY, TOTAL: Hep B Core Total Ab: NONREACTIVE

## 2021-09-22 LAB — LACTATE DEHYDROGENASE: LDH: 164 U/L (ref 98–192)

## 2021-09-22 NOTE — Progress Notes (Addendum)
? ? ?HEMATOLOGY/ONCOLOGY CONSULTATION NOTE ? ?Date of Service: 09/22/2021 ? ?Patient Care Team: ?Donald Prose, MD as PCP - General (Family Medicine) ? 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 ?HISTORY OF PRESENTING ILLNESS:  ? ?Rebecca Aguirre is a wonderful 66 y.o. 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 ?Patient is an overall healthy 66 year old lady with a history of dyslipidemia and osteoporosis and previous history of basal cell carcinoma excised in June 2017. ?Patient notes that she previously has had history of a cyst in the right axilla in September 2019 for which she was seen by dermatologist and treated with doxycycline with resolution. ?She also reports having right axillary soft tissue infection in December 1987 which was treated with Duricef. ?Patient has a history of pulmonary sarcoidosis which was apparently diagnosed in 59 by Dr. Camille Bal in [place].  It has been inactive from 2005 to present.  Patient follows with Dr. Melvyn Novas at [facility] pulmonology. ? ?Patient has previously had abnormal mammograms about 6 months ago when she was noted to have an enlarged axillary lymph node in the left axilla which was thought to be possibly reactive given she had a Shingrix vaccine in the left arm on 12/18/2020. ?On repeat mammogram/ultrasound she was noted to have bilateral symmetric axillary lymphadenopathy and therefore a core needle biopsy was recommended ? ?She had a core needle biopsy of her left axillary enlarged lymph node on 08/22/2021 which showed relatively monomorphous proliferation of small lymphoid cells characterized by high nuclear cytoplasmic ratio.  Predominance of B lymphocytes which have CD20, CD79 and CD23 positive as well as have coexpression of CD5.  No significant CD10 or cyclin D1 positivity.  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 ? 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 ? ?Patient notes no fevers no chills no night sweats no unexpected weight loss.  No new skin rashes.  No new fatigue. ?Has noticed some small lymph nodes in the neck in addition to her axillary lymph nodes. ?No abdominal pain or distention. ?No new chest pain or shortness of breath. ?No recent change in bowel or bladder habits. ?No new bone pains. ? ? ?MEDICAL HISTORY:  ?Past Medical History:  ?Diagnosis Date  ? Hirsutism   ? Hypercholesteremia   ? Osteoporosis 10/20/2016  ? T score -2.8  ? ? ?SURGICAL HISTORY: ?Past Surgical History:  ?Procedure Laterality Date  ? BASAL CELL CARCINOMA EXCISION  12/2015  ? ? ?SOCIAL HISTORY: ?Social History  ? ?Socioeconomic History  ? Marital status: Married  ?  Spouse name: Not on file  ? Number of children: Not on file  ? Years of education: Not on file  ? Highest education level: Not on file  ?Occupational History  ? Not on file  ?Tobacco Use  ? Smoking status: Never  ? Smokeless tobacco: Never  ?Vaping Use  ? Vaping Use: Never used  ?Substance and Sexual Activity  ? Alcohol use: Yes  ?  Comment: RARER  ? Drug use: No  ? Sexual activity: Not Currently  ?  Comment: intercourse age 49, less than 5 sexual partners,des neg  ?Other Topics Concern  ? Not on file  ?Social History Narrative  ? Not on file  ? ?Social Determinants of Health  ? ?Financial Resource Strain: Not on file  ?Food Insecurity: Not on file  ?Transportation  Needs: Not on file  ?Physical Activity: Not on file  ?Stress: Not on file  ?Social Connections: Not on file  ?Intimate Partner Violence: Not on file  ? ? ?FAMILY HISTORY: ?Family History  ?Problem Relation Age of Onset  ? Osteoporosis Mother   ? Memory loss Mother   ? Heart failure Father   ? Irritable bowel syndrome Brother   ?Sister was recently diagnosed with lymphoma ? ?ALLERGIES:  is allergic to gabapentin. ? ?MEDICATIONS:  ?Current  Outpatient Medications  ?Medication Sig Dispense Refill  ? CALCIUM PO Take by mouth. occasional    ? Cholecalciferol (VITAMIN D PO) Take by mouth. occasional    ? MAGNESIUM GLUCONATE PO Take 400 mg by mouth.    ? rosuvastatin (CRESTOR) 10 MG tablet Take 10 mg by mouth daily.    ? ?No current facility-administered medications for this visit.  ? ? ?REVIEW OF SYSTEMS:   ? ?10 Point review of Systems was done is negative except as noted above. ? ?PHYSICAL EXAMINATION: ?ECOG PERFORMANCE STATUS: 1 - Symptomatic but completely ambulatory ? ?. ?Vitals:  ? 09/22/21 1116  ?BP: 112/70  ?Pulse: 88  ?Resp: 16  ?Temp: 98.1 ?F (36.7 ?C)  ?SpO2: 100%  ? ?Filed Weights  ? 09/22/21 1116  ?Weight: 101 lb 5 oz (46 kg)  ? ?.Body mass index is 19.95 kg/m?. ? ?GENERAL:alert, in no acute distress and comfortable ?SKIN: no acute rashes, no significant lesions ?EYES: conjunctiva are pink and non-injected, sclera anicteric ?OROPHARYNX: MMM, no exudates, no oropharyngeal erythema or ulceration ?NECK: supple, no JVD ?LYMPH: Just palpable small submental lymph nodes bilaterally and bilateral axilla lymph nodes  ?LUNGS: clear to auscultation b/l with normal respiratory effort ?HEART: regular rate & rhythm ?ABDOMEN:  normoactive bowel sounds , non tender, not distended.  No palpable hepatosplenomegaly ?Extremity: no pedal edema ?PSYCH: alert & oriented x 3 with fluent speech ?NEURO: no focal motor/sensory deficits ? ?LABORATORY DATA:  ?I have reviewed the data as listed ? ?. ? ?  Latest Ref Rng & Units 09/22/2021  ? 12:53 PM 10/26/2017  ?  1:16 PM  ?CBC  ?WBC 4.0 - 10.5 K/uL 9.6   8.4    ?Hemoglobin 12.0 - 15.0 g/dL 13.1   15.2    ?Hematocrit 36.0 - 46.0 % 39.0   43.9    ?Platelets 150 - 400 K/uL 141   227    ? ?.CBC ?   ?Component Value Date/Time  ? WBC 9.6 09/22/2021 1253  ? RBC 4.25 09/22/2021 1253  ? HGB 13.1 09/22/2021 1253  ? HCT 39.0 09/22/2021 1253  ? PLT 141 (L) 09/22/2021 1253  ? MCV 91.8 09/22/2021 1253  ? MCH 30.8 09/22/2021 1253  ? MCHC  33.6 09/22/2021 1253  ? RDW 13.6 09/22/2021 1253  ? LYMPHSABS 5.6 (H) 09/22/2021 1253  ? MONOABS 0.6 09/22/2021 1253  ? EOSABS 0.2 09/22/2021 1253  ? BASOSABS 0.0 09/22/2021 1253  ? ? ?. ? ?  Latest Ref Rng & Units 09/22/2021  ? 12:53 PM 10/26/2017  ?  1:16 PM  ?CMP  ?Glucose 70 - 99 mg/dL 101   90    ?BUN 8 - 23 mg/dL 15   13    ?Creatinine 0.44 - 1.00 mg/dL 0.85   0.83    ?Sodium 135 - 145 mmol/L 139   140    ?Potassium 3.5 - 5.1 mmol/L 3.9   3.8    ?Chloride 98 - 111 mmol/L 102   101    ?CO2 22 -  32 mmol/L 30   30    ?Calcium 8.9 - 10.3 mg/dL 9.5   9.6    ?Total Protein 6.5 - 8.1 g/dL 7.4   6.7    ?Total Bilirubin 0.3 - 1.2 mg/dL 0.4   0.8    ?Alkaline Phos 38 - 126 U/L 54     ?AST 15 - 41 U/L 32   19    ?ALT 0 - 44 U/L 19   10    ? ? ? ?RADIOGRAPHIC STUDIES: ?I have personally reviewed the radiological images as listed and agreed with the findings in the report. ?MR CERVICAL SPINE WO CONTRAST ? ?Result Date: 09/15/2021 ?CLINICAL DATA:  Initial evaluation for neck pain with radiation to the left ear/head and left shoulder for 1 year. EXAM: MRI CERVICAL SPINE WITHOUT CONTRAST TECHNIQUE: Multiplanar, multisequence MR imaging of the cervical spine was performed. No intravenous contrast was administered. COMPARISON:  Radiograph from 11/26/2014. FINDINGS: Alignment: Reversal of the normal cervical lordosis with apex at C5-6. Trace 2 mm degenerative anterolisthesis of C4 on C5. Vertebrae: Vertebral body height maintained without acute or chronic fracture. Bone marrow signal intensity diffusely decreased on T1 weighted sequence, nonspecific, but most commonly related to anemia, smoking or obesity. Benign hemangioma partially visualize within the T2 vertebral body. No other discrete or worrisome osseous lesions. No other abnormal marrow edema. Cord: Normal signal and morphology. Posterior Fossa, vertebral arteries, paraspinal tissues: Visualized brain and posterior fossa within normal limits. Craniocervical junction normal.  Normal flow voids seen within the vertebral arteries bilaterally. Multiple prominent lymph nodes noted within the right greater than left neck, largest of which measures 1.4 cm in short axis at right level II (s

## 2021-09-23 LAB — IGG, IGA, IGM
IgA: 91 mg/dL (ref 87–352)
IgG (Immunoglobin G), Serum: 1231 mg/dL (ref 586–1602)
IgM (Immunoglobulin M), Srm: 51 mg/dL (ref 26–217)

## 2021-09-24 LAB — SURGICAL PATHOLOGY

## 2021-09-25 LAB — FLOW CYTOMETRY

## 2021-09-29 NOTE — Addendum Note (Signed)
Addended by: Sullivan Lone on: 09/29/2021 01:33 AM ? ? Modules accepted: Orders ? ?

## 2021-10-07 ENCOUNTER — Inpatient Hospital Stay: Payer: Medicare Other | Admitting: Hematology

## 2021-10-17 ENCOUNTER — Ambulatory Visit (HOSPITAL_COMMUNITY)
Admission: RE | Admit: 2021-10-17 | Discharge: 2021-10-17 | Disposition: A | Discharge status: Home / Self Care | Payer: Medicare Other | Source: Ambulatory Visit | Attending: Hematology | Admitting: Hematology

## 2021-10-17 DIAGNOSIS — J8410 Pulmonary fibrosis, unspecified: Secondary | ICD-10-CM | POA: Diagnosis not present

## 2021-10-17 DIAGNOSIS — R161 Splenomegaly, not elsewhere classified: Secondary | ICD-10-CM | POA: Insufficient documentation

## 2021-10-17 DIAGNOSIS — I700 Atherosclerosis of aorta: Secondary | ICD-10-CM | POA: Diagnosis not present

## 2021-10-17 DIAGNOSIS — C9110 Chronic lymphocytic leukemia of B-cell type not having achieved remission: Secondary | ICD-10-CM | POA: Insufficient documentation

## 2021-10-17 LAB — GLUCOSE, CAPILLARY: Glucose-Capillary: 106 mg/dL — ABNORMAL HIGH (ref 70–99)

## 2021-10-17 MED ORDER — FLUDEOXYGLUCOSE F - 18 (FDG) INJECTION
4.8500 | Freq: Once | INTRAVENOUS | Status: AC
Start: 2021-10-17 — End: 2021-10-17
  Administered 2021-10-17: 4.85 via INTRAVENOUS

## 2021-10-22 ENCOUNTER — Inpatient Hospital Stay: Payer: Medicare Other | Attending: Hematology | Admitting: Hematology

## 2021-10-22 DIAGNOSIS — C9110 Chronic lymphocytic leukemia of B-cell type not having achieved remission: Secondary | ICD-10-CM

## 2021-10-23 ENCOUNTER — Telehealth: Payer: Self-pay | Admitting: Hematology

## 2021-10-23 ENCOUNTER — Ambulatory Visit (INDEPENDENT_AMBULATORY_CARE_PROVIDER_SITE_OTHER): Payer: Medicare Other | Admitting: Obstetrics & Gynecology

## 2021-10-23 ENCOUNTER — Encounter: Payer: Self-pay | Admitting: Obstetrics & Gynecology

## 2021-10-23 VITALS — BP 110/70 | HR 68 | Resp 16 | Ht 59.0 in | Wt 100.0 lb

## 2021-10-23 DIAGNOSIS — Z780 Asymptomatic menopausal state: Secondary | ICD-10-CM

## 2021-10-23 DIAGNOSIS — M810 Age-related osteoporosis without current pathological fracture: Secondary | ICD-10-CM

## 2021-10-23 DIAGNOSIS — C9110 Chronic lymphocytic leukemia of B-cell type not having achieved remission: Secondary | ICD-10-CM

## 2021-10-23 DIAGNOSIS — Z01419 Encounter for gynecological examination (general) (routine) without abnormal findings: Secondary | ICD-10-CM

## 2021-10-23 NOTE — Telephone Encounter (Signed)
Left message with follow-up appointment per 4/19 los. ?

## 2021-10-23 NOTE — Progress Notes (Signed)
? ? ?Rebecca Aguirre September 22, 1955 [id number] ? ? ?History:    66 y.o. G2P2L2 Married.   ?  ?RP:  Established patient presenting for annual gyn exam  ?  ?HPI: CLL Dx in 09/2021.  Postmenopause, well without hormone replacement therapy.  No postmenopausal bleeding.  No pelvic pain.  Abstinent.  No vaginal discharge.  Urine and bowel movements normal.  Breasts normal. Body mass index 20.2.  Physically active regularly.  Fasting Health labs with Fam MD.  BD Osteoporosis in 2018.  Colono 2015. ? ? ?Past medical history,surgical history, family history and social history were all reviewed and documented in the EPIC chart. ? ?Gynecologic History ?No LMP recorded. Patient is postmenopausal. ? ?Obstetric History ?OB History  ?Gravida Para Term Preterm AB Living  ?2       0 2  ?SAB IAB Ectopic Multiple Live Births  ?    0      ?  ?# Outcome Date GA Lbr Len/2nd Weight Sex Delivery Anes PTL Lv  ?2 Gravida           ?1 Gravida           ? ? ? ?ROS: A ROS was performed and pertinent positives and negatives are included in the history. ?GENERAL: No fevers or chills. HEENT: No change in vision, no earache, sore throat or sinus congestion. NECK: No pain or stiffness. CARDIOVASCULAR: No chest pain or pressure. No palpitations. PULMONARY: No shortness of breath, cough or wheeze. GASTROINTESTINAL: No abdominal pain, nausea, vomiting or diarrhea, melena or bright red blood per rectum. GENITOURINARY: No urinary frequency, urgency, hesitancy or dysuria. MUSCULOSKELETAL: No joint or muscle pain, no back pain, no recent trauma. DERMATOLOGIC: No rash, no itching, no lesions. ENDOCRINE: No polyuria, polydipsia, no heat or cold intolerance. No recent change in weight. HEMATOLOGICAL: No anemia or easy bruising or bleeding. NEUROLOGIC: No headache, seizures, numbness, tingling or weakness. PSYCHIATRIC: No depression, no loss of interest in normal activity or change in sleep pattern.  ?  ? ?Exam: ? ? ?BP 110/70   Pulse 68   Resp 16   Ht '4\' 11"'$   (1.499 m)   Wt 100 lb (45.4 kg)   BMI 20.20 kg/m?  ? ?Body mass index is 20.2 kg/m?. ? ?General appearance : Well developed well nourished female. No acute distress ?HEENT: Eyes: no retinal hemorrhage or exudates,  Neck supple, trachea midline, no carotid bruits, no thyroidmegaly ?Lungs: Clear to auscultation, no rhonchi or wheezes, or rib retractions  ?Heart: Regular rate and rhythm, no murmurs or gallops ?Breast:Examined in sitting and supine position were symmetrical in appearance, no palpable masses or tenderness,  no skin retraction, no nipple inversion, no nipple discharge, no skin discoloration, no axillary or supraclavicular lymphadenopathy ?Abdomen: no palpable masses or tenderness, no rebound or guarding ?Extremities: no edema or skin discoloration or tenderness ? ?Pelvic: Vulva: Normal ?            Vagina: No gross lesions or discharge ? Cervix: No gross lesions or discharge ? Uterus  AV, normal size, shape and consistency, non-tender and mobile ? Adnexa  Without masses or tenderness ? Anus: Normal ? ? ?Assessment/Plan:  66 y.o. female for annual exam  ? ?1. Well female exam with routine gynecological exam ?CLL Dx in 09/2021.  Postmenopause, well without hormone replacement therapy.  No postmenopausal bleeding.  No pelvic pain.  Abstinent.  Pap Neg 04/2020.  No previous abnormal Pap.  Will repeat at 3-5 years.  No vaginal discharge.  Urine  and bowel movements normal.  Breasts normal. Body mass index 20.2.  Physically active regularly.  Fasting Health labs with Fam MD.  BD Osteoporosis in 2018.  Colono 2015. ? ?2. Postmenopause ?Postmenopause, well without hormone replacement therapy.  No postmenopausal bleeding.  No pelvic pain.  Abstinent. ? ?3. Age-related osteoporosis without current pathological fracture ?BD Osteoporosis in 2018.  Repeat BD now.  Vit D supplement, Ca++ total 1.5 g/d, regular weight bearing physical activities. ?- DG Bone Density; Future ? ?4. Chronic lymphocytic leukemia, Rai stage  II ([place]) ? ?Other orders ?- UNABLE TO FIND; Med Name: new chapter plant calcium bone strength ? ?Princess Bruins MD, 2:48 PM 10/23/2021 ? ?  ?

## 2021-10-24 ENCOUNTER — Encounter: Payer: Self-pay | Admitting: Obstetrics & Gynecology

## 2021-10-28 NOTE — Progress Notes (Signed)
? ? ?HEMATOLOGY/ONCOLOGY PHONE VISIT NOTE ? ?Date of Service: .10/22/2021 ? ? ?Patient Care Team: ?Donald Prose, MD as PCP - General (Family Medicine) ? ?CHIEF COMPLAINTS/PURPOSE OF CONSULTATION:  ?Follow-up for continued evaluation and management of CLL/SLL ? ?HISTORY OF PRESENTING ILLNESS:  ? ?Rebecca Aguirre is a wonderful 66 y.o. 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 ?Patient is an overall healthy 66 year old lady with a history of dyslipidemia and osteoporosis and previous history of basal cell carcinoma excised in June 2017. ?Patient notes that she previously has had history of a cyst in the right axilla in September 2019 for which she was seen by dermatologist and treated with doxycycline with resolution. ?She also reports having right axillary soft tissue infection in December 1987 which was treated with Duricef. ?Patient has a history of pulmonary sarcoidosis which was apparently diagnosed in 77 by Dr. Camille Bal in [place].  It has been inactive from 2005 to present.  Patient follows with Dr. Melvyn Novas at [facility] pulmonology. ? ?Patient has previously had abnormal mammograms about 6 months ago when she was noted to have an enlarged axillary lymph node in the left axilla which was thought to be possibly reactive given she had a Shingrix vaccine in the left arm on 12/18/2020. ?On repeat mammogram/ultrasound she was noted to have bilateral symmetric axillary lymphadenopathy and therefore a core needle biopsy was recommended ? ?She had a core needle biopsy of her left axillary enlarged lymph node on 08/22/2021 which showed relatively monomorphous proliferation of small lymphoid cells characterized by high nuclear cytoplasmic ratio.  Predominance of B lymphocytes which have CD20, CD79 and CD23 positive as well as have coexpression of CD5.  No significant CD10 or cyclin D1 positivity.  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 ? 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 ? ?Patient notes no fevers no chills no night sweats no unexpected weight loss.  No new skin rashes.  No new fatigue. ?Has noticed some small lymph nodes in the neck in addition to her axillary lymph nodes. ?No abdominal pain or distention. ?No new chest pain or shortness of breath. ?No recent change in bowel or bladder habits. ?No new bone pains. ? ?Interval history ? ?.I connected with Rebecca Aguirre on .10/22/2021 at 11:00 AM EDT by telephone visit and verified that I am speaking with the correct person using two identifiers.  ? ?I discussed the limitations, risks, security and privacy concerns of performing an evaluation and management service by telemedicine and the availability of in-person appointments. I also discussed with the patient that there may be a patient responsible charge related to this service. The patient expressed understanding and agreed to proceed.  ? ?Other persons participating in the visit and their role in the encounter: None ? ?Patient?s location: Home ?Provider?s location: [facility] ? ?Chief Complaint: Follow-up for discussion of PET CT scan and continued evaluation and management of CLL/SLL. ? ?Patient notes no acute new symptoms since her last clinic visit. ?Labs done on 09/22/2021 were discussed in detail.  CBC did show a lymphocytosis of 5.6k but no anemia and minimal thrombocytopenia with platelets of 141k ?PET CT scan done on 10/17/2021 showed numerous borderline lymph nodes in the neck supraclavicular and axillary regions with no significant hypermetabolism.  Small scattered lymph nodes in the abdomen and pelvis with no hypermetabolism.  Mild splenomegaly without  any splenic lesions. ? ? ?MEDICAL HISTORY:  ?Past Medical History:  ?Diagnosis Date  ? CLL (chronic lymphocytic leukemia) ([place])   ? Hirsutism   ? Hypercholesteremia   ?  Osteoporosis 10/20/2016  ? T score -2.8  ? ? ?SURGICAL HISTORY: ?Past Surgical History:  ?Procedure Laterality Date  ? BASAL CELL CARCINOMA EXCISION  12/2015  ? ? ?SOCIAL HISTORY: ?Social History  ? ?Socioeconomic History  ? Marital status: Married  ?  Spouse name: Not on file  ? Number of children: Not on file  ? Years of education: Not on file  ? Highest education level: Not on file  ?Occupational History  ? Not on file  ?Tobacco Use  ? Smoking status: Never  ? Smokeless tobacco: Never  ?Vaping Use  ? Vaping Use: Never used  ?Substance and Sexual Activity  ? Alcohol use: Not Currently  ? Drug use: No  ? Sexual activity: Not Currently  ?  Birth control/protection: Post-menopausal  ?  Comment: intercourse age 70, less than 5 sexual partners,des neg  ?Other Topics Concern  ? Not on file  ?Social History Narrative  ? Not on file  ? ?Social Determinants of Health  ? ?Financial Resource Strain: Not on file  ?Food Insecurity: Not on file  ?Transportation Needs: Not on file  ?Physical Activity: Not on file  ?Stress: Not on file  ?Social Connections: Not on file  ?Intimate Partner Violence: Not on file  ? ? ?FAMILY HISTORY: ?Family History  ?Problem Relation Age of Onset  ? Osteoporosis Mother   ? Memory loss Mother   ? Dementia Mother   ? Heart failure Father   ? Irritable bowel syndrome Brother   ?Sister was recently diagnosed with lymphoma ? ?ALLERGIES:  is allergic to gabapentin. ? ?MEDICATIONS:  ?Current Outpatient Medications  ?Medication Sig Dispense Refill  ? CALCIUM PO Take by mouth. occasional    ? Cholecalciferol (VITAMIN D PO) Take by mouth. occasional    ? MAGNESIUM GLUCONATE PO Take 400 mg by mouth.    ? rosuvastatin (CRESTOR) 10 MG tablet Take 10 mg by mouth daily.    ? UNABLE TO FIND Med Name: new chapter plant calcium bone strength ? ?Take 3 bid    ? ?No current facility-administered medications for this visit.  ? ? ?REVIEW OF SYSTEMS:   ? ?10 Point review of Systems was done is negative except as noted  above. ? ?PHYSICAL EXAMINATION: ?Telemedicine visit ? ?LABORATORY DATA:  ?I have reviewed the data as listed ? ?. ? ?  Latest Ref Rng & Units 09/22/2021  ? 12:53 PM 10/26/2017  ?  1:16 PM  ?CBC  ?WBC 4.0 - 10.5 K/uL 9.6   8.4    ?Hemoglobin 12.0 - 15.0 g/dL 13.1   15.2    ?Hematocrit 36.0 - 46.0 % 39.0   43.9    ?Platelets 150 - 400 K/uL 141   227    ? ?.CBC ?   ?Component Value Date/Time  ? WBC 9.6 09/22/2021 1253  ? RBC 4.25 09/22/2021 1253  ? HGB 13.1 09/22/2021 1253  ? HCT 39.0 09/22/2021 1253  ? PLT 141 (L) 09/22/2021 1253  ? MCV 91.8 09/22/2021 1253  ? MCH 30.8 09/22/2021 1253  ? MCHC 33.6 09/22/2021 1253  ? RDW 13.6 09/22/2021 1253  ? LYMPHSABS 5.6 (H) 09/22/2021 1253  ? MONOABS 0.6 09/22/2021 1253  ? EOSABS 0.2 09/22/2021 1253  ? BASOSABS 0.0 09/22/2021 1253  ? ? ?. ? ?  Latest Ref Rng &  Units 09/22/2021  ? 12:53 PM 10/26/2017  ?  1:16 PM  ?CMP  ?Glucose 70 - 99 mg/dL 101   90    ?BUN 8 - 23 mg/dL 15   13    ?Creatinine 0.44 - 1.00 mg/dL 0.85   0.83    ?Sodium 135 - 145 mmol/L 139   140    ?Potassium 3.5 - 5.1 mmol/L 3.9   3.8    ?Chloride 98 - 111 mmol/L 102   101    ?CO2 22 - 32 mmol/L 30   30    ?Calcium 8.9 - 10.3 mg/dL 9.5   9.6    ?Total Protein 6.5 - 8.1 g/dL 7.4   6.7    ?Total Bilirubin 0.3 - 1.2 mg/dL 0.4   0.8    ?Alkaline Phos 38 - 126 U/L 54     ?AST 15 - 41 U/L 32   19    ?ALT 0 - 44 U/L 19   10    ? ? ? ?RADIOGRAPHIC STUDIES: ?I have personally reviewed the radiological images as listed and agreed with the findings in the report. ?NM PET Image Initial (PI) Skull Base To Thigh ? ?Result Date: 10/18/2021 ?CLINICAL DATA:  Initial treatment strategy for chronic lymphocytic leukemia. EXAM: NUCLEAR MEDICINE PET SKULL BASE TO THIGH TECHNIQUE: 4.85 mCi F-18 FDG was injected intravenously. Full-ring PET imaging was performed from the skull base to thigh after the radiotracer. CT data was obtained and used for attenuation correction and anatomic localization. Fasting blood glucose: 106 mg/dl COMPARISON:   Cervical spine MRI 09/14/2021. CT abdomen/pelvis 10/19/2018 FINDINGS: Mediastinal blood pool activity: SUV max 1.81 Liver activity: SUV max NA NECK: Numerous borderline bilateral multi station lymph nodes in t

## 2021-12-31 ENCOUNTER — Telehealth: Payer: Self-pay | Admitting: *Deleted

## 2021-12-31 NOTE — Telephone Encounter (Signed)
Dr.Lavoie replied "Definitely needs a Mammogram 01/2022. "   Left detailed message on patient voicemail per DPR access.

## 2021-12-31 NOTE — Telephone Encounter (Signed)
Patient called had breast biopsy done in Feb 2023 from [facility] ( results are scanned in chart) patient said she was diagnosed with CLL and has seen Dr. Irene Limbo (notes in epic ) regarding CLL. Patient last screening mammogram was 01/2021, she scheduled for screening mammogram on 01/05/22. Patient asked should she have screening mammogram in 01/2022 still or wait until 01/2023? She asked because she has breast biopsy done in 08/2021. Please advise

## 2022-01-05 DIAGNOSIS — Z1231 Encounter for screening mammogram for malignant neoplasm of breast: Secondary | ICD-10-CM | POA: Diagnosis not present

## 2022-01-07 ENCOUNTER — Encounter: Payer: Self-pay | Admitting: Obstetrics & Gynecology

## 2022-01-13 DIAGNOSIS — L821 Other seborrheic keratosis: Secondary | ICD-10-CM | POA: Diagnosis not present

## 2022-01-13 DIAGNOSIS — L814 Other melanin hyperpigmentation: Secondary | ICD-10-CM | POA: Diagnosis not present

## 2022-01-13 DIAGNOSIS — D225 Melanocytic nevi of trunk: Secondary | ICD-10-CM | POA: Diagnosis not present

## 2022-01-30 DIAGNOSIS — H524 Presbyopia: Secondary | ICD-10-CM | POA: Diagnosis not present

## 2022-01-30 DIAGNOSIS — H04123 Dry eye syndrome of bilateral lacrimal glands: Secondary | ICD-10-CM | POA: Diagnosis not present

## 2022-01-30 DIAGNOSIS — H25813 Combined forms of age-related cataract, bilateral: Secondary | ICD-10-CM | POA: Diagnosis not present

## 2022-02-20 ENCOUNTER — Other Ambulatory Visit: Payer: Self-pay | Admitting: *Deleted

## 2022-02-20 DIAGNOSIS — C9110 Chronic lymphocytic leukemia of B-cell type not having achieved remission: Secondary | ICD-10-CM

## 2022-02-23 ENCOUNTER — Inpatient Hospital Stay: Payer: Medicare Other | Attending: Hematology | Admitting: Hematology

## 2022-02-23 ENCOUNTER — Inpatient Hospital Stay: Payer: Medicare Other

## 2022-02-23 VITALS — BP 101/60 | HR 74 | Temp 98.2°F | Resp 15 | Wt 100.3 lb

## 2022-02-23 DIAGNOSIS — Z85828 Personal history of other malignant neoplasm of skin: Secondary | ICD-10-CM | POA: Insufficient documentation

## 2022-02-23 DIAGNOSIS — D860 Sarcoidosis of lung: Secondary | ICD-10-CM | POA: Diagnosis not present

## 2022-02-23 DIAGNOSIS — E785 Hyperlipidemia, unspecified: Secondary | ICD-10-CM | POA: Diagnosis not present

## 2022-02-23 DIAGNOSIS — M810 Age-related osteoporosis without current pathological fracture: Secondary | ICD-10-CM | POA: Diagnosis not present

## 2022-02-23 DIAGNOSIS — C9110 Chronic lymphocytic leukemia of B-cell type not having achieved remission: Secondary | ICD-10-CM

## 2022-02-23 DIAGNOSIS — D696 Thrombocytopenia, unspecified: Secondary | ICD-10-CM | POA: Diagnosis not present

## 2022-02-23 LAB — CBC WITH DIFFERENTIAL (CANCER CENTER ONLY)
Abs Immature Granulocytes: 0.02 10*3/uL (ref 0.00–0.07)
Basophils Absolute: 0 10*3/uL (ref 0.0–0.1)
Basophils Relative: 1 %
Eosinophils Absolute: 0.2 10*3/uL (ref 0.0–0.5)
Eosinophils Relative: 2 %
HCT: 35 % — ABNORMAL LOW (ref 36.0–46.0)
Hemoglobin: 12.4 g/dL (ref 12.0–15.0)
Immature Granulocytes: 0 %
Lymphocytes Relative: 60 %
Lymphs Abs: 4.4 10*3/uL — ABNORMAL HIGH (ref 0.7–4.0)
MCH: 32 pg (ref 26.0–34.0)
MCHC: 35.4 g/dL (ref 30.0–36.0)
MCV: 90.4 fL (ref 80.0–100.0)
Monocytes Absolute: 0.5 10*3/uL (ref 0.1–1.0)
Monocytes Relative: 7 %
Neutro Abs: 2.2 10*3/uL (ref 1.7–7.7)
Neutrophils Relative %: 30 %
Platelet Count: 102 10*3/uL — ABNORMAL LOW (ref 150–400)
RBC: 3.87 MIL/uL (ref 3.87–5.11)
RDW: 14.1 % (ref 11.5–15.5)
WBC Count: 7.4 10*3/uL (ref 4.0–10.5)
nRBC: 0 % (ref 0.0–0.2)

## 2022-02-23 LAB — CMP (CANCER CENTER ONLY)
ALT: 18 U/L (ref 0–44)
AST: 33 U/L (ref 15–41)
Albumin: 3.9 g/dL (ref 3.5–5.0)
Alkaline Phosphatase: 55 U/L (ref 38–126)
Anion gap: 3 — ABNORMAL LOW (ref 5–15)
BUN: 16 mg/dL (ref 8–23)
CO2: 31 mmol/L (ref 22–32)
Calcium: 9.1 mg/dL (ref 8.9–10.3)
Chloride: 106 mmol/L (ref 98–111)
Creatinine: 0.82 mg/dL (ref 0.44–1.00)
GFR, Estimated: >60 mL/min (ref >60)
Glucose, Bld: 77 mg/dL (ref 70–99)
Potassium: 4 mmol/L (ref 3.5–5.1)
Sodium: 140 mmol/L (ref 135–145)
Total Bilirubin: 0.4 mg/dL (ref 0.3–1.2)
Total Protein: 6.7 g/dL (ref 6.5–8.1)

## 2022-02-23 LAB — LACTATE DEHYDROGENASE: LDH: 176 U/L (ref 98–192)

## 2022-02-26 ENCOUNTER — Telehealth: Payer: Self-pay | Admitting: Hematology

## 2022-02-26 NOTE — Telephone Encounter (Signed)
Left message with follow-up appointment per 8/21 los.

## 2022-03-02 DIAGNOSIS — H04322 Acute dacryocystitis of left lacrimal passage: Secondary | ICD-10-CM | POA: Diagnosis not present

## 2022-03-02 NOTE — Progress Notes (Signed)
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 Patient notes that she previously has had history of a cyst in the right axilla in September 2019 for which she was seen by dermatologist and treated with doxycycline with resolution. She also reports having right axillary soft tissue infection in December 1987 which was treated with Duricef. Patient has a history of pulmonary sarcoidosis which was apparently diagnosed in 15 by Dr. Camille Bal in [place].  It has been inactive from 2005 to present.  Patient follows with Dr. Melvyn Novas at [facility] pulmonology.  Patient has previously had abnormal mammograms about 6 months ago when she was noted to have an enlarged axillary lymph node in the left axilla which was thought to be possibly reactive given she had a Shingrix vaccine in the left arm on 12/18/2020. On repeat mammogram/ultrasound she was noted to have bilateral symmetric axillary lymphadenopathy and therefore a core needle biopsy was recommended  She had a core needle biopsy of her left axillary enlarged lymph node on 08/22/2021 which showed relatively monomorphous proliferation of small lymphoid cells characterized by high nuclear cytoplasmic ratio.  Predominance of B lymphocytes which have CD20, CD79 and CD23 positive as well as have coexpression of CD5.  No significant CD10 or cyclin D1 positivity.  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  Patient notes no fevers no chills no night sweats no unexpected weight loss.  No new skin rashes.  No new fatigue. Has noticed some small lymph nodes in the neck in addition to her axillary lymph nodes. No abdominal pain or distention. No new chest pain or shortness of breath. No recent change in bowel or bladder habits. No new bone pains.  Interval history   Rebecca Aguirre is here for continued evaluation and management of her CLL/SLL.  She notes no acute new symptoms. No fevers no chills no night sweats no unexpected weight loss.  No new significant lumps or bumps.  No abdominal pain or distention.  No new chest pain or shortness of breath. No other acute new constitutional symptoms. Labs done today were discussed in detail with the patient.  MEDICAL HISTORY:  Past Medical History:  Diagnosis Date   CLL (chronic lymphocytic leukemia) ([place])    Hirsutism    Hypercholesteremia    Osteoporosis 10/20/2016   T score -2.8    SURGICAL HISTORY: Past Surgical History:  Procedure Laterality Date   BASAL CELL CARCINOMA EXCISION  12/2015    SOCIAL HISTORY: Social History   Socioeconomic History   Marital status: Married    Spouse name: Not on file   Number of children: Not on file   Years of education: Not on file   Highest education level: Not on file  Occupational History   Not on file  Tobacco Use   Smoking status: Never   Smokeless tobacco: Never  Vaping Use  Vaping Use: Never used  Substance and Sexual Activity   Alcohol use: Not Currently   Drug use: No   Sexual activity: Not Currently    Birth control/protection: Post-menopausal    Comment: intercourse age 28, less than 5 sexual partners,des neg  Other Topics Concern   Not on file  Social History Narrative   Not on file   Social Determinants of Health   Financial  Resource Strain: Not on file  Food Insecurity: Not on file  Transportation Needs: Not on file  Physical Activity: Not on file  Stress: Not on file  Social Connections: Not on file  Intimate Partner Violence: Not on file    FAMILY HISTORY: Family History  Problem Relation Age of Onset   Osteoporosis Mother    Memory loss Mother    Dementia Mother    Heart failure Father    Irritable bowel syndrome Brother   Sister was recently diagnosed with lymphoma  ALLERGIES:  is allergic to gabapentin.  MEDICATIONS:  Current Outpatient Medications  Medication Sig Dispense Refill   CALCIUM PO Take by mouth. occasional     Cholecalciferol (VITAMIN D PO) Take by mouth. occasional     MAGNESIUM GLUCONATE PO Take 400 mg by mouth.     rosuvastatin (CRESTOR) 10 MG tablet Take 10 mg by mouth daily.     UNABLE TO FIND Med Name: new chapter plant calcium bone strength  Take 3 bid     No current facility-administered medications for this visit.    REVIEW OF SYSTEMS:    10 Point review of Systems was done is negative except as noted above.  PHYSICAL EXAMINATION: .BP 101/60 (BP Location: Left Arm, Patient Position: Sitting)   Pulse 74   Temp 98.2 F (36.8 C) (Temporal)   Resp 15   Wt 100 lb 4.8 oz (45.5 kg)   SpO2 99%   BMI 20.26 kg/m  NAD GENERAL:alert, in no acute distress and comfortable SKIN: no acute rashes, no significant lesions EYES: conjunctiva are pink and non-injected, sclera anicteric OROPHARYNX: MMM, no exudates, no oropharyngeal erythema or ulceration NECK: supple, no JVD LYMPH:  no palpable lymphadenopathy in the cervical, axillary or inguinal regions LUNGS: clear to auscultation b/l with normal respiratory effort HEART: regular rate & rhythm ABDOMEN:  normoactive bowel sounds , non tender, not distended. Extremity: no pedal edema PSYCH: alert & oriented x 3 with fluent speech NEURO: no focal motor/sensory deficits   LABORATORY DATA:  I have reviewed the data as  listed  .    Latest Ref Rng & Units 02/23/2022   11:46 AM 09/22/2021   12:53 PM 10/26/2017    1:16 PM  CBC  WBC 4.0 - 10.5 K/uL 7.4  9.6  8.4   Hemoglobin 12.0 - 15.0 g/dL 12.4  13.1  15.2   Hematocrit 36.0 - 46.0 % 35.0  39.0  43.9   Platelets 150 - 400 K/uL 102  141  227    .CBC    Component Value Date/Time   WBC 7.4 02/23/2022 1146   WBC 9.6 09/22/2021 1253   RBC 3.87 02/23/2022 1146   HGB 12.4 02/23/2022 1146   HCT 35.0 (L) 02/23/2022 1146   PLT 102 (L) 02/23/2022 1146   MCV 90.4 02/23/2022 1146   MCH 32.0 02/23/2022 1146   MCHC 35.4 02/23/2022 1146   RDW 14.1 02/23/2022 1146   LYMPHSABS 4.4 (H) 02/23/2022 1146   MONOABS 0.5 02/23/2022 1146   EOSABS 0.2 02/23/2022 1146   BASOSABS 0.0  02/23/2022 1146    .    Latest Ref Rng & Units 02/23/2022   11:46 AM 09/22/2021   12:53 PM 10/26/2017    1:16 PM  CMP  Glucose 70 - 99 mg/dL 77  101  90   BUN 8 - 23 mg/dL '16  15  13   '$ Creatinine 0.44 - 1.00 mg/dL 0.82  0.85  0.83   Sodium 135 - 145 mmol/L 140  C 139  140   Potassium 3.5 - 5.1 mmol/L 4.0  3.9  3.8   Chloride 98 - 111 mmol/L 106  C 102  101   CO2 22 - 32 mmol/L 31  C 30  30   Calcium 8.9 - 10.3 mg/dL 9.1  9.5  9.6   Total Protein 6.5 - 8.1 g/dL 6.7  7.4  6.7   Total Bilirubin 0.3 - 1.2 mg/dL 0.4  0.4  0.8   Alkaline Phos 38 - 126 U/L 55  54    AST 15 - 41 U/L 33  32  19   ALT 0 - 44 U/L '18  19  10     '$ C Corrected result   . Lab Results  Component Value Date   LDH 176 02/23/2022      RADIOGRAPHIC STUDIES: I have personally reviewed the radiological images as listed and agreed with the findings in the report. No results found.  ASSESSMENT & PLAN:   66 year old female with a history of previous pulmonary sarcoidosis, dyslipidemia, osteoporosis with  #1  Recently diagnosed CLL/SLL based on biopsy of incidentally noted persistence left axillary lymphadenopathy on routine mammogram. Labs today showed lymphocytosis of 5.6k Clinically patient has a few small  cervical lymph nodes and bilateral axillary lymph nodes.  #2 mild thrombocytopenia 102k  Plan -Patients labs from today were discussed in detail with her. CBC shows developing thrombocytopenia platelets are 102k no significant anemia or lymphocytosis. CMP stable LDH within normal limits at 176 Patient has no clinical symptoms of CLL progression at this time. Clear indication for initiating CLL treatment at this time.  Follow-up Return to clinic with Dr. Irene Limbo with labs in 4 months.  The total time spent in the appointment was 20 minutes*.  All of the patient's questions were answered with apparent satisfaction. The patient knows to call the clinic with any problems, questions or concerns.   Sullivan Lone MD MS AAHIVMS [facility] Hematology/Oncology Physician [facility]  .*Total Encounter Time as defined by the Centers for Medicare and Medicaid Services includes, in addition to the face-to-face time of a patient visit (documented in the note above) non-face-to-face time: obtaining and reviewing outside history, ordering and reviewing medications, tests or procedures, care coordination (communications with other health care professionals or caregivers) and documentation in the medical record.

## 2022-03-11 DIAGNOSIS — R161 Splenomegaly, not elsewhere classified: Secondary | ICD-10-CM | POA: Diagnosis not present

## 2022-03-11 DIAGNOSIS — R1012 Left upper quadrant pain: Secondary | ICD-10-CM | POA: Diagnosis not present

## 2022-03-11 DIAGNOSIS — Z800 Family history of malignant neoplasm of digestive organs: Secondary | ICD-10-CM | POA: Diagnosis not present

## 2022-03-11 DIAGNOSIS — C9110 Chronic lymphocytic leukemia of B-cell type not having achieved remission: Secondary | ICD-10-CM | POA: Diagnosis not present

## 2022-03-12 DIAGNOSIS — H04322 Acute dacryocystitis of left lacrimal passage: Secondary | ICD-10-CM | POA: Diagnosis not present

## 2022-03-20 ENCOUNTER — Encounter: Payer: Self-pay | Admitting: Hematology

## 2022-04-02 DIAGNOSIS — H04322 Acute dacryocystitis of left lacrimal passage: Secondary | ICD-10-CM | POA: Diagnosis not present

## 2022-04-08 DIAGNOSIS — R161 Splenomegaly, not elsewhere classified: Secondary | ICD-10-CM | POA: Diagnosis not present

## 2022-04-08 DIAGNOSIS — R1032 Left lower quadrant pain: Secondary | ICD-10-CM | POA: Diagnosis not present

## 2022-04-08 DIAGNOSIS — C9110 Chronic lymphocytic leukemia of B-cell type not having achieved remission: Secondary | ICD-10-CM | POA: Diagnosis not present

## 2022-04-08 DIAGNOSIS — R194 Change in bowel habit: Secondary | ICD-10-CM | POA: Diagnosis not present

## 2022-04-24 DIAGNOSIS — M810 Age-related osteoporosis without current pathological fracture: Secondary | ICD-10-CM | POA: Diagnosis not present

## 2022-04-24 DIAGNOSIS — E559 Vitamin D deficiency, unspecified: Secondary | ICD-10-CM | POA: Diagnosis not present

## 2022-04-24 DIAGNOSIS — I700 Atherosclerosis of aorta: Secondary | ICD-10-CM | POA: Diagnosis not present

## 2022-04-24 DIAGNOSIS — R7303 Prediabetes: Secondary | ICD-10-CM | POA: Diagnosis not present

## 2022-04-24 DIAGNOSIS — Z0000 Encounter for general adult medical examination without abnormal findings: Secondary | ICD-10-CM | POA: Diagnosis not present

## 2022-04-24 DIAGNOSIS — C9110 Chronic lymphocytic leukemia of B-cell type not having achieved remission: Secondary | ICD-10-CM | POA: Diagnosis not present

## 2022-04-24 DIAGNOSIS — H04302 Unspecified dacryocystitis of left lacrimal passage: Secondary | ICD-10-CM | POA: Diagnosis not present

## 2022-04-24 DIAGNOSIS — Z1331 Encounter for screening for depression: Secondary | ICD-10-CM | POA: Diagnosis not present

## 2022-04-24 DIAGNOSIS — E7800 Pure hypercholesterolemia, unspecified: Secondary | ICD-10-CM | POA: Diagnosis not present

## 2022-04-30 ENCOUNTER — Encounter: Payer: Self-pay | Admitting: Hematology

## 2022-04-30 DIAGNOSIS — R1032 Left lower quadrant pain: Secondary | ICD-10-CM | POA: Diagnosis not present

## 2022-04-30 DIAGNOSIS — R197 Diarrhea, unspecified: Secondary | ICD-10-CM | POA: Diagnosis not present

## 2022-04-30 DIAGNOSIS — R194 Change in bowel habit: Secondary | ICD-10-CM | POA: Diagnosis not present

## 2022-05-11 DIAGNOSIS — Z23 Encounter for immunization: Secondary | ICD-10-CM | POA: Diagnosis not present

## 2022-05-13 DIAGNOSIS — D696 Thrombocytopenia, unspecified: Secondary | ICD-10-CM | POA: Diagnosis not present

## 2022-05-13 DIAGNOSIS — R233 Spontaneous ecchymoses: Secondary | ICD-10-CM | POA: Diagnosis not present

## 2022-05-13 DIAGNOSIS — R252 Cramp and spasm: Secondary | ICD-10-CM | POA: Diagnosis not present

## 2022-05-13 DIAGNOSIS — C9110 Chronic lymphocytic leukemia of B-cell type not having achieved remission: Secondary | ICD-10-CM | POA: Diagnosis not present

## 2022-05-25 DIAGNOSIS — Z23 Encounter for immunization: Secondary | ICD-10-CM | POA: Diagnosis not present

## 2022-06-01 DIAGNOSIS — R634 Abnormal weight loss: Secondary | ICD-10-CM | POA: Diagnosis not present

## 2022-06-01 DIAGNOSIS — R194 Change in bowel habit: Secondary | ICD-10-CM | POA: Diagnosis not present

## 2022-06-02 ENCOUNTER — Telehealth: Payer: Self-pay | Admitting: Hematology

## 2022-06-02 NOTE — Telephone Encounter (Signed)
Called patient to r/s dec. Appt. Patient notified of new appt time.

## 2022-06-16 ENCOUNTER — Other Ambulatory Visit: Payer: Self-pay

## 2022-06-16 ENCOUNTER — Ambulatory Visit (INDEPENDENT_AMBULATORY_CARE_PROVIDER_SITE_OTHER): Payer: Medicare Other

## 2022-06-16 ENCOUNTER — Other Ambulatory Visit: Payer: Self-pay | Admitting: Obstetrics & Gynecology

## 2022-06-16 DIAGNOSIS — C9110 Chronic lymphocytic leukemia of B-cell type not having achieved remission: Secondary | ICD-10-CM

## 2022-06-16 DIAGNOSIS — M810 Age-related osteoporosis without current pathological fracture: Secondary | ICD-10-CM

## 2022-06-16 DIAGNOSIS — Z780 Asymptomatic menopausal state: Secondary | ICD-10-CM

## 2022-06-16 DIAGNOSIS — Z13820 Encounter for screening for osteoporosis: Secondary | ICD-10-CM

## 2022-06-16 DIAGNOSIS — Z01419 Encounter for gynecological examination (general) (routine) without abnormal findings: Secondary | ICD-10-CM

## 2022-06-17 ENCOUNTER — Inpatient Hospital Stay (HOSPITAL_BASED_OUTPATIENT_CLINIC_OR_DEPARTMENT_OTHER): Payer: Medicare Other | Admitting: Hematology

## 2022-06-17 ENCOUNTER — Inpatient Hospital Stay: Payer: Medicare Other | Attending: Hematology

## 2022-06-17 VITALS — BP 101/70 | HR 91 | Temp 97.5°F | Resp 18 | Ht 59.0 in | Wt 98.0 lb

## 2022-06-17 DIAGNOSIS — C9110 Chronic lymphocytic leukemia of B-cell type not having achieved remission: Secondary | ICD-10-CM | POA: Diagnosis not present

## 2022-06-17 DIAGNOSIS — D696 Thrombocytopenia, unspecified: Secondary | ICD-10-CM | POA: Insufficient documentation

## 2022-06-17 DIAGNOSIS — Z85828 Personal history of other malignant neoplasm of skin: Secondary | ICD-10-CM | POA: Insufficient documentation

## 2022-06-17 LAB — CBC WITH DIFFERENTIAL (CANCER CENTER ONLY)
Abs Immature Granulocytes: 0.01 10*3/uL (ref 0.00–0.07)
Basophils Absolute: 0 10*3/uL (ref 0.0–0.1)
Basophils Relative: 1 %
Eosinophils Absolute: 0.3 10*3/uL (ref 0.0–0.5)
Eosinophils Relative: 4 %
HCT: 35.4 % — ABNORMAL LOW (ref 36.0–46.0)
Hemoglobin: 12 g/dL (ref 12.0–15.0)
Immature Granulocytes: 0 %
Lymphocytes Relative: 59 %
Lymphs Abs: 4 10*3/uL (ref 0.7–4.0)
MCH: 32 pg (ref 26.0–34.0)
MCHC: 33.9 g/dL (ref 30.0–36.0)
MCV: 94.4 fL (ref 80.0–100.0)
Monocytes Absolute: 0.5 10*3/uL (ref 0.1–1.0)
Monocytes Relative: 7 %
Neutro Abs: 2 10*3/uL (ref 1.7–7.7)
Neutrophils Relative %: 29 %
Platelet Count: 104 10*3/uL — ABNORMAL LOW (ref 150–400)
RBC: 3.75 MIL/uL — ABNORMAL LOW (ref 3.87–5.11)
RDW: 15.3 % (ref 11.5–15.5)
Smear Review: NORMAL
WBC Count: 6.8 10*3/uL (ref 4.0–10.5)
nRBC: 0 % (ref 0.0–0.2)

## 2022-06-17 LAB — CMP (CANCER CENTER ONLY)
ALT: 20 U/L (ref 0–44)
AST: 33 U/L (ref 15–41)
Albumin: 3.9 g/dL (ref 3.5–5.0)
Alkaline Phosphatase: 62 U/L (ref 38–126)
Anion gap: 4 — ABNORMAL LOW (ref 5–15)
BUN: 15 mg/dL (ref 8–23)
CO2: 32 mmol/L (ref 22–32)
Calcium: 9.5 mg/dL (ref 8.9–10.3)
Chloride: 104 mmol/L (ref 98–111)
Creatinine: 0.85 mg/dL (ref 0.44–1.00)
GFR, Estimated: >60 mL/min (ref >60)
Glucose, Bld: 94 mg/dL (ref 70–99)
Potassium: 4.3 mmol/L (ref 3.5–5.1)
Sodium: 140 mmol/L (ref 135–145)
Total Bilirubin: 0.4 mg/dL (ref 0.3–1.2)
Total Protein: 6.7 g/dL (ref 6.5–8.1)

## 2022-06-17 LAB — LACTATE DEHYDROGENASE: LDH: 179 U/L (ref 98–192)

## 2022-06-17 NOTE — Progress Notes (Signed)
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 Patient notes that she previously has had history of a cyst in the right axilla in September 2019 for which she was seen by dermatologist and treated with doxycycline with resolution. She also reports having right axillary soft tissue infection in December 1987 which was treated with Duricef. Patient has a history of pulmonary sarcoidosis which was apparently diagnosed in 91 by Dr. Camille Bal in [place].  It has been inactive from 2005 to present.  Patient follows with Dr. Melvyn Novas at [facility] pulmonology.  Patient has previously had abnormal mammograms about 6 months ago when she was noted to have an enlarged axillary lymph node in the left axilla which was thought to be possibly reactive given she had a Shingrix vaccine in the left arm on 12/18/2020. On repeat mammogram/ultrasound she was noted to have bilateral symmetric axillary lymphadenopathy and therefore a core needle biopsy was recommended  She had a core needle biopsy of her left axillary enlarged lymph node on 08/22/2021 which showed relatively monomorphous proliferation of small lymphoid cells characterized by high nuclear cytoplasmic ratio.  Predominance of B lymphocytes which have CD20, CD79 and CD23 positive as well as have coexpression of CD5.  No significant CD10 or cyclin D1 positivity.  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  Patient notes no fevers no chills no night sweats no unexpected weight loss.  No new skin rashes.  No new fatigue. Has noticed some small lymph nodes in the neck in addition to her axillary lymph nodes. No abdominal pain or distention. No new chest pain or shortness of breath. No recent change in bowel or bladder habits. No new bone pains.  Interval history   [name] is here for continued evaluation and management of her CLL/SLL.   Patient was last seen by me on 02/23/2022 and she was doing well.   Patient reports she has been doing fairly well since our last visit. She reports she recently had endoscopy result showed polyps and is planning getting removed soon. She complains of consistent left leg pain and consistent bilateral leg cramps. She also notes that she has insomnia due to personal stress. She denies fatigue, low energy, fever, chills, night sweats, new lumps/bumps, abnormal bowl moments, shortness of breath, chest pain, abdominal pain, back pain, or leg swelling.   Her lump near her neck has not changed sine our last visit. She does complains of pain at the spot.   She reports she does have osteoporosis and is getting followed up for it. Patient notes that she is trying to stay physically active.   She complains of weight loss, losing about 5 lbs since February, because of occasional loss of appetite.  Patient reports that recently had bloody left eye, which she show her PCP, but was undiagnosed. She was given eye drops, but does not recall  the name.   Patient has received the influenza vaccine and Pneumonia vaccine, denies RSV vaccine and COVID-19 Booster.    MEDICAL HISTORY:  Past Medical History:  Diagnosis Date   CLL (chronic lymphocytic leukemia) ([place])    Hirsutism    Hypercholesteremia    Osteoporosis 10/20/2016   T  score -2.8    SURGICAL HISTORY: Past Surgical History:  Procedure Laterality Date   BASAL CELL CARCINOMA EXCISION  12/2015    SOCIAL HISTORY: Social History   Socioeconomic History   Marital status: Married    Spouse name: Not on file   Number of children: Not on file   Years of education: Not on file   Highest education level: Not on file  Occupational History   Not on file  Tobacco Use   Smoking status: Never   Smokeless tobacco: Never  Vaping Use   Vaping Use: Never used  Substance and Sexual Activity   Alcohol use: Not Currently   Drug use: No   Sexual activity: Not Currently    Birth control/protection: Post-menopausal    Comment: intercourse age 72, less than 5 sexual partners,des neg  Other Topics Concern   Not on file  Social History Narrative   Not on file   Social Determinants of Health   Financial Resource Strain: Not on file  Food Insecurity: Not on file  Transportation Needs: Not on file  Physical Activity: Not on file  Stress: Not on file  Social Connections: Not on file  Intimate Partner Violence: Not on file    FAMILY HISTORY: Family History  Problem Relation Age of Onset   Osteoporosis Mother    Memory loss Mother    Dementia Mother    Heart failure Father    Irritable bowel syndrome Brother   Sister was recently diagnosed with lymphoma  ALLERGIES:  is allergic to gabapentin.  MEDICATIONS:  Current Outpatient Medications  Medication Sig Dispense Refill   CALCIUM PO Take by mouth. occasional     Cholecalciferol (VITAMIN D PO) Take by mouth. occasional     MAGNESIUM GLUCONATE PO Take 400 mg by mouth.     rosuvastatin (CRESTOR) 10 MG tablet Take 10 mg by mouth daily.     UNABLE TO FIND Med Name: new chapter plant calcium bone strength  Take 3 bid     No current facility-administered medications for this visit.    REVIEW OF SYSTEMS:    10 Point review of Systems was done is negative except as noted above.  PHYSICAL  EXAMINATION: .BP 101/70 (BP Location: Right Arm, Patient Position: Sitting)   Pulse 91   Temp (!) 97.5 F (36.4 C) (Tympanic)   Resp 18   Ht '4\' 11"'$  (1.499 m)   Wt 98 lb (44.5 kg)   SpO2 99%   BMI 19.79 kg/m  NAD GENERAL:alert, in no acute distress and comfortable SKIN: no acute rashes, no significant lesions EYES: conjunctiva are pink and non-injected, sclera anicteric OROPHARYNX: MMM, no exudates, no oropharyngeal erythema or ulceration NECK: supple, no JVD LYMPH:  no palpable lymphadenopathy in the cervical, axillary or inguinal regions LUNGS: clear to auscultation b/l with normal respiratory effort HEART: regular rate & rhythm ABDOMEN:  normoactive bowel sounds , non tender, not distended. Extremity: no pedal edema PSYCH: alert & oriented x 3 with fluent speech NEURO: no focal motor/sensory deficits   LABORATORY DATA:  I have reviewed the data as listed  .    Latest Ref Rng & Units 06/17/2022   11:15  AM 02/23/2022   11:46 AM 09/22/2021   12:53 PM  CBC  WBC 4.0 - 10.5 K/uL 6.8  7.4  9.6   Hemoglobin 12.0 - 15.0 g/dL 12.0  12.4  13.1   Hematocrit 36.0 - 46.0 % 35.4  35.0  39.0   Platelets 150 - 400 K/uL 104  102  141    .CBC    Component Value Date/Time   WBC 6.8 06/17/2022 1115   WBC 9.6 09/22/2021 1253   RBC 3.75 (L) 06/17/2022 1115   HGB 12.0 06/17/2022 1115   HCT 35.4 (L) 06/17/2022 1115   PLT 104 (L) 06/17/2022 1115   MCV 94.4 06/17/2022 1115   MCH 32.0 06/17/2022 1115   MCHC 33.9 06/17/2022 1115   RDW 15.3 06/17/2022 1115   LYMPHSABS 4.0 06/17/2022 1115   MONOABS 0.5 06/17/2022 1115   EOSABS 0.3 06/17/2022 1115   BASOSABS 0.0 06/17/2022 1115    .    Latest Ref Rng & Units 06/17/2022   11:15 AM 02/23/2022   11:46 AM 09/22/2021   12:53 PM  CMP  Glucose 70 - 99 mg/dL 94  77  101   BUN 8 - 23 mg/dL '15  16  15   '$ Creatinine 0.44 - 1.00 mg/dL 0.85  0.82  0.85   Sodium 135 - 145 mmol/L 140  140  C 139   Potassium 3.5 - 5.1 mmol/L 4.3  4.0  3.9    Chloride 98 - 111 mmol/L 104  106  C 102   CO2 22 - 32 mmol/L 32  31  C 30   Calcium 8.9 - 10.3 mg/dL 9.5  9.1  9.5   Total Protein 6.5 - 8.1 g/dL 6.7  6.7  7.4   Total Bilirubin 0.3 - 1.2 mg/dL 0.4  0.4  0.4   Alkaline Phos 38 - 126 U/L 62  55  54   AST 15 - 41 U/L 33  33  32   ALT 0 - 44 U/L '20  18  19     '$ C Corrected result   . Lab Results  Component Value Date   LDH 179 06/17/2022      RADIOGRAPHIC STUDIES: I have personally reviewed the radiological images as listed and agreed with the findings in the report. No results found.  ASSESSMENT & PLAN:   66 year old female with a history of previous pulmonary sarcoidosis, dyslipidemia, osteoporosis with  #1  Recently diagnosed CLL/SLL based on biopsy of incidentally noted persistence left axillary lymphadenopathy on routine mammogram. Labs today showed lymphocytosis of 5.6k Clinically patient has a few small cervical lymph nodes and bilateral axillary lymph nodes.  #2 mild thrombocytopenia 102k  PLAN: -Discussed the lab results from today with the patient. CBC shows WBC of 6.8 K, hemoglobin of 12.0 K, hematocrit of 35.4, and platelets of 104. CMP stable. LDH at 179. -Recommended RSV vaccine and COVID-19 Booster.  -Patient has no clinical symptoms of CLL progression at this time. -CT scan before patient next visit with me.  FOLLOW-UP: CT Chest/abd/pelvis in 14 weeks RTC with Dr Irene Limbo with labs in 16 weeks  The total time spent in the appointment was 20 minutes* .  All of the patient's questions were answered with apparent satisfaction. The patient knows to call the clinic with any problems, questions or concerns.   Sullivan Lone MD MS AAHIVMS [facility] Hematology/Oncology Physician [facility]  .*Total Encounter Time as defined by the Centers for Medicare and Medicaid Services includes, in  addition to the face-to-face time of a patient visit (documented in the note above) non-face-to-face time: obtaining  and reviewing outside history, ordering and reviewing medications, tests or procedures, care coordination (communications with other health care professionals or caregivers) and documentation in the medical record.   I, Cleda Mccreedy, am acting as a Education administrator for Sullivan Lone, MD. .I have reviewed the above documentation for accuracy and completeness, and I agree with the above. Brunetta Genera MD

## 2022-06-26 ENCOUNTER — Other Ambulatory Visit: Payer: Medicare Other

## 2022-06-26 ENCOUNTER — Ambulatory Visit: Payer: Medicare Other | Admitting: Hematology

## 2022-07-10 ENCOUNTER — Encounter: Payer: Self-pay | Admitting: Obstetrics & Gynecology

## 2022-07-10 ENCOUNTER — Encounter: Payer: Self-pay | Admitting: Hematology

## 2022-07-13 ENCOUNTER — Encounter: Payer: Self-pay | Admitting: Obstetrics & Gynecology

## 2022-08-05 ENCOUNTER — Telehealth: Payer: Self-pay | Admitting: Hematology

## 2022-08-05 NOTE — Telephone Encounter (Signed)
Called patient per provider PAL. Patient rescheduled and notified.

## 2022-09-28 ENCOUNTER — Encounter (HOSPITAL_COMMUNITY): Payer: Self-pay

## 2022-09-28 ENCOUNTER — Ambulatory Visit (HOSPITAL_COMMUNITY)
Admission: RE | Admit: 2022-09-28 | Discharge: 2022-09-28 | Disposition: A | Discharge status: Home / Self Care | Payer: Medicare Other | Source: Ambulatory Visit | Attending: Hematology | Admitting: Hematology

## 2022-09-28 DIAGNOSIS — C9110 Chronic lymphocytic leukemia of B-cell type not having achieved remission: Secondary | ICD-10-CM | POA: Insufficient documentation

## 2022-09-28 DIAGNOSIS — R918 Other nonspecific abnormal finding of lung field: Secondary | ICD-10-CM | POA: Diagnosis not present

## 2022-09-28 DIAGNOSIS — K3189 Other diseases of stomach and duodenum: Secondary | ICD-10-CM | POA: Diagnosis not present

## 2022-09-28 DIAGNOSIS — K7689 Other specified diseases of liver: Secondary | ICD-10-CM | POA: Diagnosis not present

## 2022-09-28 MED ORDER — IOHEXOL 300 MG/ML  SOLN
75.0000 mL | Freq: Once | INTRAMUSCULAR | Status: AC | PRN
  Administered 2022-09-28: 75 mL via INTRAVENOUS

## 2022-09-28 MED ORDER — IOHEXOL 9 MG/ML PO SOLN
1000.0000 mL | ORAL | Status: AC
  Administered 2022-09-28: 1000 mL via ORAL

## 2022-09-28 MED ORDER — SODIUM CHLORIDE (PF) 0.9 % IJ SOLN
INTRAMUSCULAR | Status: AC
  Filled 2022-09-28: qty 50

## 2022-10-07 ENCOUNTER — Other Ambulatory Visit: Payer: Medicare Other

## 2022-10-07 ENCOUNTER — Ambulatory Visit: Payer: Medicare Other | Admitting: Hematology

## 2022-10-14 ENCOUNTER — Other Ambulatory Visit: Payer: Self-pay

## 2022-10-14 DIAGNOSIS — C9110 Chronic lymphocytic leukemia of B-cell type not having achieved remission: Secondary | ICD-10-CM

## 2022-10-16 ENCOUNTER — Inpatient Hospital Stay: Payer: Medicare Other | Attending: Hematology

## 2022-10-16 ENCOUNTER — Other Ambulatory Visit: Payer: Self-pay

## 2022-10-16 ENCOUNTER — Inpatient Hospital Stay (HOSPITAL_BASED_OUTPATIENT_CLINIC_OR_DEPARTMENT_OTHER): Payer: Medicare Other | Admitting: Hematology

## 2022-10-16 ENCOUNTER — Inpatient Hospital Stay: Payer: Medicare Other

## 2022-10-16 VITALS — BP 92/78 | HR 88 | Temp 97.5°F | Resp 18 | Wt 99.3 lb

## 2022-10-16 DIAGNOSIS — C9110 Chronic lymphocytic leukemia of B-cell type not having achieved remission: Secondary | ICD-10-CM

## 2022-10-16 LAB — CBC WITH DIFFERENTIAL (CANCER CENTER ONLY)
Abs Immature Granulocytes: 0.02 10*3/uL (ref 0.00–0.07)
Basophils Absolute: 0 10*3/uL (ref 0.0–0.1)
Basophils Relative: 0 %
Eosinophils Absolute: 0.1 10*3/uL (ref 0.0–0.5)
Eosinophils Relative: 2 %
HCT: 36.2 % (ref 36.0–46.0)
Hemoglobin: 12.2 g/dL (ref 12.0–15.0)
Immature Granulocytes: 0 %
Lymphocytes Relative: 68 %
Lymphs Abs: 4.6 10*3/uL — ABNORMAL HIGH (ref 0.7–4.0)
MCH: 32.2 pg (ref 26.0–34.0)
MCHC: 33.7 g/dL (ref 30.0–36.0)
MCV: 95.5 fL (ref 80.0–100.0)
Monocytes Absolute: 0.4 10*3/uL (ref 0.1–1.0)
Monocytes Relative: 6 %
Neutro Abs: 1.6 10*3/uL — ABNORMAL LOW (ref 1.7–7.7)
Neutrophils Relative %: 24 %
Platelet Count: 97 10*3/uL — ABNORMAL LOW (ref 150–400)
RBC: 3.79 MIL/uL — ABNORMAL LOW (ref 3.87–5.11)
RDW: 14.1 % (ref 11.5–15.5)
Smear Review: NORMAL
WBC Count: 6.8 10*3/uL (ref 4.0–10.5)
nRBC: 0 % (ref 0.0–0.2)

## 2022-10-16 LAB — CMP (CANCER CENTER ONLY)
ALT: 18 U/L (ref 0–44)
AST: 34 U/L (ref 15–41)
Albumin: 3.8 g/dL (ref 3.5–5.0)
Alkaline Phosphatase: 76 U/L (ref 38–126)
Anion gap: 4 — ABNORMAL LOW (ref 5–15)
BUN: 18 mg/dL (ref 8–23)
CO2: 31 mmol/L (ref 22–32)
Calcium: 9.3 mg/dL (ref 8.9–10.3)
Chloride: 107 mmol/L (ref 98–111)
Creatinine: 0.83 mg/dL (ref 0.44–1.00)
GFR, Estimated: >60 mL/min (ref >60)
Glucose, Bld: 105 mg/dL — ABNORMAL HIGH (ref 70–99)
Potassium: 3.8 mmol/L (ref 3.5–5.1)
Sodium: 142 mmol/L (ref 135–145)
Total Bilirubin: 0.5 mg/dL (ref 0.3–1.2)
Total Protein: 6.9 g/dL (ref 6.5–8.1)

## 2022-10-16 LAB — LACTATE DEHYDROGENASE: LDH: 176 U/L (ref 98–192)

## 2022-10-16 NOTE — Progress Notes (Signed)
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 Patient notes that she previously has had history of a cyst in the right axilla in September 2019 for which she was seen by dermatologist and treated with doxycycline with resolution. She also reports having right axillary soft tissue infection in December 1987 which was treated with Duricef. Patient has a history of pulmonary sarcoidosis which was apparently diagnosed in 66 by Rebecca Aguirre in [place].  It has been inactive from 2005 to present.  Patient follows with Dr. Sherene Aguirre at [facility] pulmonology.  Patient has previously had abnormal mammograms about 6 months ago when she was noted to have an enlarged axillary lymph node in the left axilla which was thought to be possibly reactive given she had a Shingrix vaccine in the left arm on 12/18/2020. On repeat mammogram/ultrasound she was noted to have bilateral symmetric axillary lymphadenopathy and therefore a core needle biopsy was recommended  She had a core needle biopsy of her left axillary enlarged lymph node on 08/22/2021 which showed relatively monomorphous proliferation of small lymphoid cells characterized by high nuclear cytoplasmic ratio.  Predominance of B lymphocytes which have CD20, CD79 and CD23 positive as well as have coexpression of CD5.  No significant CD10 or cyclin D1 positivity.  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  Patient notes no fevers no chills no night sweats no unexpected weight loss.  No new skin rashes.  No new fatigue. Has noticed some small lymph nodes in the neck in addition to her axillary lymph nodes. No abdominal pain or distention. No new chest pain or shortness of breath. No recent change in bowel or bladder habits. No new bone pains.  Interval history  Rebecca Aguirre is here for continued evaluation and management of her CLL/SLL.   Patient was last seen by me on 06/17/2022 and complained of consistent leg pain and bilateral leg cramps. She also reported insomnia related to stress as well as pain in the lump near her neck. Additionally, she noted a weight loss of 5 lbs due to occasional loss of appetite. Also, she complained of a blood in her left eye.  Today, she reports that she sometimes feels the lymph nodes on her neck fluctuate in size. She denies any significant new lymph nodes or any CLL-related fatigue. Patient denies any fever, chills, or night sweats.  She does complain of fluctuating bilateral leg cramps in her feet and toes, which always occurs at night and may last 15 minutes. She was told that it is likely muscle cramping. Sometimes, her legs feel stiff in the mornings and she sometimes feels numbness in her legs.  She does currently take 10 MG Crestor. She did try adjusting her Crestor medication for 1-2 months in the past, which did improve her symptoms. She also reports that if she drinks two cups of tea, it sometime  improves symptoms, but not always. She also notes some mild abdominal cramping on her right side when bending.   Occasionally, especially when lying down, she complains of a skipped beat in her heart. She has worn a holter monitor in the past and reports that she did have symptoms while wearing it. Her symptoms do not  limit her walking. She denies any back pain or recent infections. She has not been on any steroids recently.   She does sometimes notice a sense of fullness and the sensation of needing to have a bowel movement when she does not. She denies any significant weight loss. She denies any SOB when lying down on her back.  MEDICAL HISTORY:  Past Medical History:  Diagnosis Date   CLL (chronic lymphocytic leukemia) (HCC)    Hirsutism    Hypercholesteremia    Osteoporosis 10/20/2016   T score -2.8    SURGICAL HISTORY: Past Surgical History:  Procedure Laterality Date   BASAL CELL CARCINOMA EXCISION  12/2015    SOCIAL HISTORY: Social History   Socioeconomic History   Marital status: Married    Spouse name: Not on file   Number of children: Not on file   Years of education: Not on file   Highest education level: Not on file  Occupational History   Not on file  Tobacco Use   Smoking status: Never   Smokeless tobacco: Never  Vaping Use   Vaping Use: Never used  Substance and Sexual Activity   Alcohol use: Not Currently   Drug use: No   Sexual activity: Not Currently    Birth control/protection: Post-menopausal    Comment: intercourse age 21, less than 5 sexual partners,des neg  Other Topics Concern   Not on file  Social History Narrative   Not on file   Social Determinants of Health   Financial Resource Strain: Not on file  Food Insecurity: Not on file  Transportation Needs: Not on file  Physical Activity: Not on file  Stress: Not on file  Social Connections: Not on file  Intimate Partner Violence: Not on file    FAMILY HISTORY: Family History  Problem Relation Age of Onset   Osteoporosis Mother    Memory loss Mother    Dementia Mother    Heart failure Father    Irritable bowel syndrome Brother   Sister was recently diagnosed with lymphoma  ALLERGIES:  is allergic to gabapentin.  MEDICATIONS:  Current Outpatient Medications  Medication Sig Dispense Refill    CALCIUM PO Take by mouth. occasional     Cholecalciferol (VITAMIN D PO) Take by mouth. occasional     MAGNESIUM GLUCONATE PO Take 400 mg by mouth.     rosuvastatin (CRESTOR) 10 MG tablet Take 10 mg by mouth daily.     UNABLE TO FIND Med Name: new chapter plant calcium bone strength  Take 3 bid     No current facility-administered medications for this visit.    REVIEW OF SYSTEMS:    10 Point review of Systems was done is negative except as noted above.   PHYSICAL EXAMINATION: .BP 92/78   Pulse 88   Temp (!) 97.5 F (36.4 C)   Resp 18   Wt 99 lb 4.8 oz (45 kg)   SpO2 97%   BMI 20.06 kg/m   GENERAL:alert, in no acute distress and comfortable SKIN: no acute rashes, no significant lesions EYES: conjunctiva are pink and non-injected, sclera anicteric OROPHARYNX: MMM, no exudates, no oropharyngeal erythema or ulceration NECK:  supple, no JVD LYMPH:  no palpable lymphadenopathy in the cervical, axillary or inguinal regions LUNGS: clear to auscultation b/l with normal respiratory effort HEART: regular rate & rhythm ABDOMEN:  normoactive bowel sounds , non tender, not distended. Extremity: no pedal edema PSYCH: alert & oriented x 3 with fluent speech NEURO: no focal motor/sensory deficits   LABORATORY DATA:  I have reviewed the data as listed  .    Latest Ref Rng & Units 10/16/2022    9:09 AM 06/17/2022   11:15 AM 02/23/2022   11:46 AM  CBC  WBC 4.0 - 10.5 K/uL 6.8  6.8  7.4   Hemoglobin 12.0 - 15.0 g/dL 16.1  09.6  04.5   Hematocrit 36.0 - 46.0 % 36.2  35.4  35.0   Platelets 150 - 400 K/uL 97  104  102    .CBC    Component Value Date/Time   WBC 6.8 10/16/2022 0909   WBC 9.6 09/22/2021 1253   RBC 3.79 (L) 10/16/2022 0909   HGB 12.2 10/16/2022 0909   HCT 36.2 10/16/2022 0909   PLT 97 (L) 10/16/2022 0909   MCV 95.5 10/16/2022 0909   MCH 32.2 10/16/2022 0909   MCHC 33.7 10/16/2022 0909   RDW 14.1 10/16/2022 0909   LYMPHSABS PENDING 10/16/2022 0909   MONOABS  PENDING 10/16/2022 0909   EOSABS PENDING 10/16/2022 0909   BASOSABS PENDING 10/16/2022 0909    .    Latest Ref Rng & Units 06/17/2022   11:15 AM 02/23/2022   11:46 AM 09/22/2021   12:53 PM  CMP  Glucose 70 - 99 mg/dL 94  77  409   BUN 8 - 23 mg/dL 15  16  15    Creatinine 0.44 - 1.00 mg/dL 8.11  9.14  7.82   Sodium 135 - 145 mmol/L 140  140  C 139   Potassium 3.5 - 5.1 mmol/L 4.3  4.0  3.9   Chloride 98 - 111 mmol/L 104  106  C 102   CO2 22 - 32 mmol/L 32  31  C 30   Calcium 8.9 - 10.3 mg/dL 9.5  9.1  9.5   Total Protein 6.5 - 8.1 g/dL 6.7  6.7  7.4   Total Bilirubin 0.3 - 1.2 mg/dL 0.4  0.4  0.4   Alkaline Phos 38 - 126 U/L 62  55  54   AST 15 - 41 U/L 33  33  32   ALT 0 - 44 U/L 20  18  19      C Corrected result   . Lab Results  Component Value Date   LDH 179 06/17/2022      RADIOGRAPHIC STUDIES: I have personally reviewed the radiological images as listed and agreed with the findings in the report. CT CHEST ABDOMEN PELVIS W CONTRAST  Result Date: 09/28/2022 CLINICAL DATA:  Evaluate for progression of CLL. * Tracking Code: BO * EXAM: CT CHEST, ABDOMEN, AND PELVIS WITH CONTRAST TECHNIQUE: Multidetector CT imaging of the chest, abdomen and pelvis was performed following the standard protocol during bolus administration of intravenous contrast. RADIATION DOSE REDUCTION: This exam was performed according to the departmental dose-optimization program which includes automated exposure control, adjustment of the mA and/or kV according to patient size and/or use of iterative reconstruction technique. CONTRAST:  75mL OMNIPAQUE IOHEXOL 300 MG/ML  SOLN COMPARISON:  Multiple priors including PET-CT October 17, 2021 FINDINGS: CT CHEST FINDINGS Cardiovascular: Normal caliber thoracic aorta. No central pulmonary embolus on this nondedicated study. Normal size  heart. Coronary artery calcifications. No suspicious pericardial effusion/thickening. Mediastinum/Nodes: Similar size of the enlarged  supraclavicular, mediastinal and bilateral axillary lymph nodes. Previously indexed lymph nodes are as follows: -left axillary lymph node measures 10 mm in short axis on image 14/2 previously 11 mm -right axillary lymph node measures 9 mm in short axis on image 13/2 previously 8 mm. Lungs/Pleura: Biapical pleuroparenchymal scarring. Stable scattered tiny pulmonary nodules for instance a 3 mm perifissural nodule in the right upper lobe on image 61/4 is unchanged and a 3 mm perifissural nodule in the right lower lobe on image 77/4 is also unchanged. No pleural effusion. No pneumothorax. Musculoskeletal: Similar size of the tiny soft tissue nodule posterior to the muscles of the right shoulder girdle the largest of which measures 3 mm in short axis on image 14/2, unchanged and favored to reflect subcutaneous lymph nodes. No aggressive lytic or blastic lesion of bone. CT ABDOMEN PELVIS FINDINGS Hepatobiliary: No suspicious hepatic lesion. Unchanged size of the hypodense 17 mm lesion in the right lobe of the liver on image 60/2 which on today's examination demonstrates some peripheral nodular enhancement and was echogenic on prior ultrasound from September 30, 2018 compatible with a benign hemangioma. Gallbladder is unremarkable. No biliary ductal dilation. Pancreas: No pancreatic ductal dilation or evidence of acute inflammation. Spleen: Increased splenomegaly measuring 18.3 cm in maximum craniocaudal dimension and 15 cm in maximum axial dimension previously 11.7 cm. Adrenals/Urinary Tract: Bilateral adrenal glands appear normal. No hydronephrosis. Kidneys demonstrate symmetric enhancement and excretion of contrast material. Urinary bladder is unremarkable for degree of distension. Stomach/Bowel: Radiopaque enteric contrast material traverses the splenic flexure. Stomach is minimally distended limiting evaluation. No pathologic dilation of small or large bowel. Normal appendix. No evidence of acute bowel inflammation.  Vascular/Lymphatic: Aortic atherosclerosis. Normal caliber abdominal aorta. Similar prominent mesenteric and retroperitoneal lymph nodes. Previously indexed lymph nodes are as follows: -aortocaval lymph node measures 8 mm in short axis on image 71/2, unchanged. -left external iliac lymph node measures 8 mm in short axis on image 105/2 previously 9 mm. -right external iliac lymph node measures 7 mm in short axis on image 105/2 previously 8 mm. Reproductive: Uterus and bilateral adnexa are unremarkable. Other: No significant abdominopelvic free fluid. Musculoskeletal: No aggressive lytic or blastic lesion of bone. IMPRESSION: 1. Increased splenomegaly. 2. Similar size of the enlarged supraclavicular, mediastinal, bilateral axillary, mesenteric, retroperitoneal and pelvic lymph nodes. 3. Stable scattered tiny pulmonary nodules, favored intrapulmonary lymph nodes. 4. Stable size of the hypodense lesion in the right lobe of the liver which on today's examination demonstrates some peripheral nodular enhancement and was echogenic on prior ultrasound from September 30, 2018 compatible with a benign hemangioma. 5.  Aortic Atherosclerosis (ICD10-I70.0). Electronically Signed   By: Maudry Mayhew M.D.   On: 09/28/2022 18:09    ASSESSMENT & PLAN:   67 year old female with a history of previous pulmonary sarcoidosis, dyslipidemia, osteoporosis with  #1  Recently diagnosed CLL/SLL based on biopsy of incidentally noted persistence left axillary lymphadenopathy on routine mammogram. Labs today showed lymphocytosis of 5.6k Clinically patient has a few small cervical lymph nodes and bilateral axillary lymph nodes.  #2 mild thrombocytopenia 102k  PLAN:. -Discussed lab results on 10/16/2022 with patient. CBC showed WBC of 6.8, hemoglobin of 12.2, and platelets of 97K. -CMP normal -Discussed results of CT chest/abdm/pelvis 09/28/2022 which revealed Similar size of the enlarged supraclavicular, mediastinal and bilateral  axillary lymph nodes. Left axillary lymph node is 10 cm. Right axillary lymph node is 9  cm. Minimal lung nodule present. Increased splenomegaly measuring 18.3 cm in maximum craniocaudal dimension. -informed patient that magnesium can reduce muscle cramping threshold  -suggested tonic water to possibly improve bone pain -Discussed option of patient reaching out to PCP to consider lowering cholesterol medicine to potentially improve LE bone pain -suggested patient to use Kardia[employer] application to recognize heart rhythms -advised patient to drink adequate levels of water and electrolytes, especially if on diuretics or acid suppressants -order CLL fish panel genetic testing  -discussed that the risk of rupture does increase as spleen increases in size for further evaluation to consider treatment options -splenomegaly is a possible reason to initiate treatment at this time. -answered all of patient questions in detail -discussed option of an [place] in 6 months  FOLLOW-UP: Additional lab today RTC with Dr Candise Che with labs in 4 months  The total time spent in the appointment was 21 minutes* .  All of the patient's questions were answered with apparent satisfaction. The patient knows to call the clinic with any problems, questions or concerns.   Wyvonnia Lora MD MS AAHIVMS [facility] Hematology/On[facility]  .*Total Encounter Time as defined by the Centers for Medicare and Medicaid Services includes, in addition to the face-to-face time of a patient visit (documented in the note above) non-face-to-face time: obtaining and reviewing outside history, ordering and reviewing medications, tests or procedures, care coordination (communications with other health care professionals or caregivers) and documentation in the medical record.    I,Mitra Faeizi,acting as a Neurosurgeon for Wyvonnia Lora, MD.,have documented all relevant documentation on the behalf of Wyvonnia Lora, MD,as directed  by  Wyvonnia Lora, MD while in the presence of Wyvonnia Lora, MD.  .I have reviewed the above documentation for accuracy and completeness, and I agree with the above. Johney Maine MD

## 2022-10-19 ENCOUNTER — Telehealth: Payer: Self-pay | Admitting: Hematology

## 2022-10-26 LAB — FISH,CLL PROGNOSTIC PANEL

## 2022-10-27 ENCOUNTER — Ambulatory Visit: Payer: Medicare Other | Admitting: Hematology

## 2022-10-27 ENCOUNTER — Other Ambulatory Visit: Payer: Medicare Other

## 2022-11-22 IMAGING — PT NM PET TUM IMG INITIAL (PI) SKULL BASE T - THIGH
1 of 7 series · 1 of 25 positions shown · non-contrast
Comparison: Cervical spine MRI 09/14/2021. CT abdomen/pelvis
10/19/2018

CLINICAL DATA: Initial treatment strategy for chronic lymphocytic
leukemia.

EXAM:
NUCLEAR MEDICINE PET SKULL BASE TO THIGH
TECHNIQUE: 4.85 mCi F-18 FDG was injected intravenously. Full-ring PET imaging
was performed from the skull base to thigh after the radiotracer. CT
data was obtained and used for attenuation correction and anatomic
localization.
Fasting blood glucose: 106 mg/dl

[Series 4: ct sk_thigh 5.0 br38 · axial · 5.0mm · 0.98mm/px · 1 of 217 slices shown]
[im 217/217  brain]
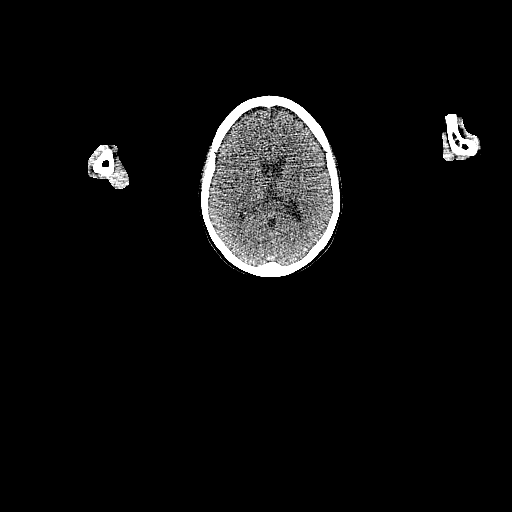

[1 of 25 positions shown; findings below may reference images not displayed]

FINDINGS: Mediastinal blood pool activity: SUV max

Liver activity: SUV max NA

NECK: Numerous borderline bilateral multi station lymph nodes in the
neck but no hypermetabolism.

Incidental CT findings: none

CHEST: Numerous calcified mediastinal and hilar lymph nodes without
hypermetabolism. There also numerous enlarged supraclavicular and
axillary lymph nodes bilaterally. No hypermetabolism is
demonstrated. Index node in the left axilla on image 57/4 measures
10.5 mm. SUV max is 2.0. Index right axillary node on image 57/4
measures 8 mm. SUV max is 1.81. A few small scattered subpleural
nodules are likely small lymph nodes. A calcified granuloma is noted
in the left upper lobe.

No hypermetabolic breast masses.

Incidental CT findings: None

ABDOMEN/PELVIS: Mild splenomegaly but no splenic lesions or splenic
hypermetabolism. No hepatic or adrenal gland lesions.

Small scattered mesenteric and retroperitoneal lymph nodes without
overt adenopathy. Small bilateral common iliac nodes. Small pelvic
sidewall nodes. Left obturator node measures 9 mm on image 170/4.
Right-sided node measures 8 mm on the same image. No
hypermetabolism. No inguinal nodes. No hypermetabolism.

Incidental CT findings: Age advanced atherosclerotic calcifications
involving the aorta and iliac arteries but no aneurysm.

SKELETON: No hypermetabolic skeletal lesions.

Incidental CT findings: none
IMPRESSION: 1. Numerous borderline lymph nodes in the neck, supraclavicular and
axillary regions. No significant hypermetabolism. Uptake very close
to background mediastinal activity.
2. Small scattered abdomen and pelvis nodes but no hypermetabolism.
3. Mild splenomegaly but no splenic lesions or splenic
hypermetabolism.

## 2022-12-01 DIAGNOSIS — J069 Acute upper respiratory infection, unspecified: Secondary | ICD-10-CM | POA: Diagnosis not present

## 2022-12-04 ENCOUNTER — Ambulatory Visit: Payer: Medicare Other | Admitting: Obstetrics & Gynecology

## 2022-12-25 ENCOUNTER — Telehealth: Payer: Self-pay

## 2022-12-25 ENCOUNTER — Encounter: Payer: Self-pay | Admitting: Obstetrics & Gynecology

## 2022-12-25 ENCOUNTER — Ambulatory Visit (INDEPENDENT_AMBULATORY_CARE_PROVIDER_SITE_OTHER): Payer: Medicare Other | Admitting: Obstetrics & Gynecology

## 2022-12-25 VITALS — BP 116/84 | HR 74

## 2022-12-25 DIAGNOSIS — K644 Residual hemorrhoidal skin tags: Secondary | ICD-10-CM

## 2022-12-25 DIAGNOSIS — N9089 Other specified noninflammatory disorders of vulva and perineum: Secondary | ICD-10-CM

## 2022-12-25 DIAGNOSIS — L290 Pruritus ani: Secondary | ICD-10-CM

## 2022-12-25 DIAGNOSIS — D280 Benign neoplasm of vulva: Secondary | ICD-10-CM | POA: Diagnosis not present

## 2022-12-25 MED ORDER — HYDROCORTISONE ACETATE 1 % EX OINT: 1.0000 g | TOPICAL_OINTMENT | Freq: Two times a day (BID) | CUTANEOUS | 2 refills | Status: AC

## 2022-12-25 NOTE — Telephone Encounter (Signed)
Office closes at 1pm on Friday. LVM on machine for triage to cb.

## 2022-12-25 NOTE — Telephone Encounter (Signed)
-----   Message from Genia Del, MD sent at 12/25/2022  2:21 PM EDT ----- Regarding: Dermato Dr Dareen Piano at [place] 5 vulvar lesions for excisions.  Please call Dermato Dr Dareen Piano at [place] office to advance her appointment.

## 2022-12-25 NOTE — Progress Notes (Signed)
    Rebecca Aguirre April 27, 1956 [id number]        67 y.o.  G2P0002   RP: Vulvar lesions  HPI: Vulvar lesions.  Not painful or tender, but feels them as they are raised.  Perianal itching.  Known external hemorrhoids.  No rectal bleeding.   OB History  Gravida Para Term Preterm AB Living  2       0 2  SAB IAB Ectopic Multiple Live Births      0        # Outcome Date GA Lbr Len/2nd Weight Sex Delivery Anes PTL Lv  2 Gravida           1 Gravida             Past medical history,surgical history, problem list, medications, allergies, family history and social history were all reviewed and documented in the EPIC chart.   Directed ROS with pertinent positives and negatives documented in the history of present illness/assessment and plan.  Exam:  Vitals:   12/25/22 1334  BP: 116/84  Pulse: 74  SpO2: 97%   General appearance:  Normal  Gynecologic exam: Vulva:  Physical Exam Genitourinary:                                       Perianal area:  Non-thrombosed, not bleeding external hemorrhoids   Assessment/Plan:  67 y.o. G2P0002   1. Vulvar lesions Vulvar lesions.  2 probable hemangiomas.  2 skin tags and 1 mole with 2 colors. Not painful or tender, but feels them as they are raised. Will call her Dermato, Dr Dareen Piano, to advance her appointment.   2. Anal itching Perianal itching.  Known external hemorrhoids.  No rectal bleeding.  3. External hemorrhoids Uncomplicated external hemorrhoids.  Hydrocortisone Acetate 1% ointment BID x 14 days.  Other orders - Hydrocortisone Acetate 1 % OINT; Apply 1 g topically 2 (two) times daily for 14 days.   Genia Del MD, 2:05 PM 12/25/2022

## 2022-12-28 NOTE — Telephone Encounter (Signed)
Spoke w/ Nida Boatman whom transferred me to Selena Batten (nurse) whom stated that pt was currently scheduled for 7/31 @ 1040am. She reports that at this time, Dr. Dareen Piano did not have any cancellations or opening but advise me to advise pt to call office and check for cancellation tomorrow morning @ 845am. Stated to advise pt that she may he routed to a call center but pt may ask for her.  Pt notified and voiced understanding.  OK to forward her recent OV notes to derm so they have for reference?

## 2022-12-29 NOTE — Telephone Encounter (Signed)
Per ML: "Of course. Dr. Elbert Ewings"  Recent OV notes faxed to Dr. Dareen Piano @ [facility]. Will close encounter.

## 2023-01-05 DIAGNOSIS — L290 Pruritus ani: Secondary | ICD-10-CM | POA: Diagnosis not present

## 2023-01-05 DIAGNOSIS — R143 Flatulence: Secondary | ICD-10-CM | POA: Diagnosis not present

## 2023-01-05 DIAGNOSIS — R7303 Prediabetes: Secondary | ICD-10-CM | POA: Diagnosis not present

## 2023-01-05 DIAGNOSIS — J069 Acute upper respiratory infection, unspecified: Secondary | ICD-10-CM | POA: Diagnosis not present

## 2023-01-05 DIAGNOSIS — R5383 Other fatigue: Secondary | ICD-10-CM | POA: Diagnosis not present

## 2023-01-05 DIAGNOSIS — R634 Abnormal weight loss: Secondary | ICD-10-CM | POA: Diagnosis not present

## 2023-01-05 DIAGNOSIS — C9110 Chronic lymphocytic leukemia of B-cell type not having achieved remission: Secondary | ICD-10-CM | POA: Diagnosis not present

## 2023-01-05 LAB — LAB REPORT - SCANNED
EGFR: 72
Free T4: 0.85 ng/dL
HM HIV Screening: NEGATIVE
TSH: 6.53 — AB (ref 0.41–5.90)

## 2023-01-27 DIAGNOSIS — Z1231 Encounter for screening mammogram for malignant neoplasm of breast: Secondary | ICD-10-CM | POA: Diagnosis not present

## 2023-01-29 DIAGNOSIS — M7918 Myalgia, other site: Secondary | ICD-10-CM | POA: Diagnosis not present

## 2023-02-03 DIAGNOSIS — D225 Melanocytic nevi of trunk: Secondary | ICD-10-CM | POA: Diagnosis not present

## 2023-02-03 DIAGNOSIS — Z08 Encounter for follow-up examination after completed treatment for malignant neoplasm: Secondary | ICD-10-CM | POA: Diagnosis not present

## 2023-02-03 DIAGNOSIS — L814 Other melanin hyperpigmentation: Secondary | ICD-10-CM | POA: Diagnosis not present

## 2023-02-03 DIAGNOSIS — L810 Postinflammatory hyperpigmentation: Secondary | ICD-10-CM | POA: Diagnosis not present

## 2023-02-03 DIAGNOSIS — L821 Other seborrheic keratosis: Secondary | ICD-10-CM | POA: Diagnosis not present

## 2023-02-03 DIAGNOSIS — Z85828 Personal history of other malignant neoplasm of skin: Secondary | ICD-10-CM | POA: Diagnosis not present

## 2023-02-03 DIAGNOSIS — L918 Other hypertrophic disorders of the skin: Secondary | ICD-10-CM | POA: Diagnosis not present

## 2023-02-03 DIAGNOSIS — D485 Neoplasm of uncertain behavior of skin: Secondary | ICD-10-CM | POA: Diagnosis not present

## 2023-02-05 DIAGNOSIS — H25813 Combined forms of age-related cataract, bilateral: Secondary | ICD-10-CM | POA: Diagnosis not present

## 2023-02-05 DIAGNOSIS — H04123 Dry eye syndrome of bilateral lacrimal glands: Secondary | ICD-10-CM | POA: Diagnosis not present

## 2023-02-05 DIAGNOSIS — H04322 Acute dacryocystitis of left lacrimal passage: Secondary | ICD-10-CM | POA: Diagnosis not present

## 2023-02-05 DIAGNOSIS — H35372 Puckering of macula, left eye: Secondary | ICD-10-CM | POA: Diagnosis not present

## 2023-02-11 ENCOUNTER — Other Ambulatory Visit: Payer: Self-pay

## 2023-02-11 DIAGNOSIS — C9110 Chronic lymphocytic leukemia of B-cell type not having achieved remission: Secondary | ICD-10-CM

## 2023-02-12 ENCOUNTER — Inpatient Hospital Stay (HOSPITAL_BASED_OUTPATIENT_CLINIC_OR_DEPARTMENT_OTHER): Payer: Medicare Other | Admitting: Hematology

## 2023-02-12 ENCOUNTER — Other Ambulatory Visit: Payer: Self-pay

## 2023-02-12 ENCOUNTER — Inpatient Hospital Stay: Payer: Medicare Other | Attending: Hematology

## 2023-02-12 VITALS — BP 111/69 | HR 81 | Temp 98.3°F | Resp 16 | Wt 99.8 lb

## 2023-02-12 DIAGNOSIS — M810 Age-related osteoporosis without current pathological fracture: Secondary | ICD-10-CM | POA: Insufficient documentation

## 2023-02-12 DIAGNOSIS — E7800 Pure hypercholesterolemia, unspecified: Secondary | ICD-10-CM | POA: Diagnosis not present

## 2023-02-12 DIAGNOSIS — Z79899 Other long term (current) drug therapy: Secondary | ICD-10-CM | POA: Insufficient documentation

## 2023-02-12 DIAGNOSIS — Z85828 Personal history of other malignant neoplasm of skin: Secondary | ICD-10-CM | POA: Diagnosis not present

## 2023-02-12 DIAGNOSIS — R252 Cramp and spasm: Secondary | ICD-10-CM | POA: Insufficient documentation

## 2023-02-12 DIAGNOSIS — C9110 Chronic lymphocytic leukemia of B-cell type not having achieved remission: Secondary | ICD-10-CM | POA: Insufficient documentation

## 2023-02-12 DIAGNOSIS — E785 Hyperlipidemia, unspecified: Secondary | ICD-10-CM | POA: Insufficient documentation

## 2023-02-12 LAB — CMP (CANCER CENTER ONLY)
ALT: 22 U/L (ref 0–44)
AST: 35 U/L (ref 15–41)
Albumin: 3.8 g/dL (ref 3.5–5.0)
Alkaline Phosphatase: 95 U/L (ref 38–126)
Anion gap: 4 — ABNORMAL LOW (ref 5–15)
BUN: 18 mg/dL (ref 8–23)
CO2: 30 mmol/L (ref 22–32)
Calcium: 8.7 mg/dL — ABNORMAL LOW (ref 8.9–10.3)
Chloride: 107 mmol/L (ref 98–111)
Creatinine: 0.79 mg/dL (ref 0.44–1.00)
GFR, Estimated: >60 mL/min (ref >60)
Glucose, Bld: 98 mg/dL (ref 70–99)
Potassium: 4.4 mmol/L (ref 3.5–5.1)
Sodium: 141 mmol/L (ref 135–145)
Total Bilirubin: 0.5 mg/dL (ref 0.3–1.2)
Total Protein: 6.8 g/dL (ref 6.5–8.1)

## 2023-02-12 LAB — CBC WITH DIFFERENTIAL (CANCER CENTER ONLY)
Abs Immature Granulocytes: 0.02 10*3/uL (ref 0.00–0.07)
Basophils Absolute: 0 10*3/uL (ref 0.0–0.1)
Basophils Relative: 0 %
Eosinophils Absolute: 0.2 10*3/uL (ref 0.0–0.5)
Eosinophils Relative: 3 %
HCT: 34.4 % — ABNORMAL LOW (ref 36.0–46.0)
Hemoglobin: 11.3 g/dL — ABNORMAL LOW (ref 12.0–15.0)
Immature Granulocytes: 0 %
Lymphocytes Relative: 70 %
Lymphs Abs: 4.3 10*3/uL — ABNORMAL HIGH (ref 0.7–4.0)
MCH: 31.9 pg (ref 26.0–34.0)
MCHC: 32.8 g/dL (ref 30.0–36.0)
MCV: 97.2 fL (ref 80.0–100.0)
Monocytes Absolute: 0.4 10*3/uL (ref 0.1–1.0)
Monocytes Relative: 6 %
Neutro Abs: 1.3 10*3/uL — ABNORMAL LOW (ref 1.7–7.7)
Neutrophils Relative %: 21 %
Platelet Count: 84 10*3/uL — ABNORMAL LOW (ref 150–400)
RBC: 3.54 MIL/uL — ABNORMAL LOW (ref 3.87–5.11)
RDW: 16.3 % — ABNORMAL HIGH (ref 11.5–15.5)
Smear Review: NORMAL
WBC Count: 6.2 10*3/uL (ref 4.0–10.5)
nRBC: 0 % (ref 0.0–0.2)

## 2023-02-12 LAB — LACTATE DEHYDROGENASE: LDH: 188 U/L (ref 98–192)

## 2023-02-12 NOTE — Progress Notes (Signed)
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 Patient notes that she previously has had history of a cyst in the right axilla in September 2019 for which she was seen by dermatologist and treated with doxycycline with resolution. She also reports having right axillary soft tissue infection in December 1987 which was treated with Duricef. Patient has a history of pulmonary sarcoidosis which was apparently diagnosed in 16 by Dr. Eliseo Gum in [place].  It has been inactive from 2005 to present.  Patient follows with Dr. Sherene Sires at [facility] pulmonology.  Patient has previously had abnormal mammograms about 6 months ago when she was noted to have an enlarged axillary lymph node in the left axilla which was thought to be possibly reactive given she had a Shingrix vaccine in the left arm on 12/18/2020. On repeat mammogram/ultrasound she was noted to have bilateral symmetric axillary lymphadenopathy and therefore a core needle biopsy was recommended  She had a core needle biopsy of her left axillary enlarged lymph node on 08/22/2021 which showed relatively monomorphous proliferation of small lymphoid cells characterized by high nuclear cytoplasmic ratio.  Predominance of B lymphocytes which have CD20, CD79 and CD23 positive as well as have coexpression of CD5.  No significant CD10 or cyclin D1  positivity.  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  Patient notes no fevers no chills no night sweats no unexpected weight loss.  No new skin rashes.  No new fatigue. Has noticed some small lymph nodes in the neck in addition to her axillary lymph nodes. No abdominal pain or distention. No new chest pain or shortness of breath. No recent change in bowel or bladder habits. No new bone pains.  Interval history  Rebecca Aguirre is here for her 35-month follow-up for continued evaluation and management of her CLL/SLL.   Patient was last seen by me on 10/16/2022 and reported neck lymph nodes fluctuating in size, fluctuating bilateral leg cramping in feet and toes during the night, lower extremity stiffness in mornings, leg numbness, mild right-sided abdominal cramping when bending, occasional skipped heart beats when lying down, and urge to have a bowel movement without having one.   Today, she reports that she did endorse a possible infection in mid-may after receiving visitors from [place] who endorsed an infection with sneezing and coughing symptoms. Patient reports that she soon after also experienced similar symptoms with a lack of energy for 2 weeks. She did endorse discomfort in her spleen while she was infected. Zyrtec did not improve symptoms. Her coughing and sneezing has since resolved.   Patient also endorsed an infection in June. She notes that in July, she had a fever with a temperature reading of 101 and endorsed chills at that time. Her fever has since resolved. She denies any chills or other respiratory symptoms. No new  enlarged lymph nodes.  She reports that she will be seen by a functional medicine practitioner soon.   Patient had a colonoscopy in October which did reveal one small polyp. She reports that she did have a  mammogram recently which showed normal results.   She does report a mildly larger lymph node in her right-sided neck compared to the left. She denies any lymph nodes in her groin. Patient denies any abdominal pain or leg swelling.  Patient has endorsed muscle cramping for several years and has been on crestor since 2007. She tried Secretary/administrator for some time which did not improve symptoms.   She reports endorsing severe sharp pain in the buttocks with deep soreness. The area is not tender to touch. Patient notes experiencing excruciating pain when walking upstairs. Muscle relaxer did not improve symptoms. She continue to have pain in her buttocks at this time.   Her weight has been fairly stable recently and she endorses normal p.o. intake. She does sometimes endorse an early sense of fullness with food consumption.  She does complain muscle cramping in her hands. She does stay well hydrated.  Patient reports that her father's sister had a history of CLL, lung cancer, and anal cancer. She notes that her aunt smoked two packs of cigarettes daily.   MEDICAL HISTORY:  Past Medical History:  Diagnosis Date   CLL (chronic lymphocytic leukemia) (HCC)    Hirsutism    Hypercholesteremia    Osteoporosis 10/20/2016   T score -2.8    SURGICAL HISTORY: Past Surgical History:  Procedure Laterality Date   BASAL CELL CARCINOMA EXCISION  12/2015    SOCIAL HISTORY: Social History   Socioeconomic History   Marital status: Married    Spouse name: Not on file   Number of children: Not on file   Years of education: Not on file   Highest education level: Not on file  Occupational History   Not on file  Tobacco Use   Smoking status: Never   Smokeless tobacco: Never  Vaping Use   Vaping status: Never Used  Substance and Sexual Activity   Alcohol use: Not Currently   Drug use: No   Sexual activity: Not Currently    Birth control/protection: Post-menopausal    Comment: intercourse age 51,  less than 5 sexual partners,des neg  Other Topics Concern   Not on file  Social History Narrative   Not on file   Social Determinants of Health   Financial Resource Strain: Not on file  Food Insecurity: Not on file  Transportation Needs: Not on file  Physical Activity: Not on file  Stress: Not on file  Social Connections: Not on file  Intimate Partner Violence: Not on file    FAMILY HISTORY: Family History  Problem Relation Age of Onset   Osteoporosis Mother    Memory loss Mother    Dementia Mother    Heart failure Father    Irritable bowel syndrome Brother   Sister was recently diagnosed with lymphoma  ALLERGIES:  is allergic to gabapentin.  MEDICATIONS:  Current Outpatient Medications  Medication Sig Dispense Refill   CALCIUM PO Take by mouth. occasional     Cholecalciferol (VITAMIN D PO) Take by mouth. occasional     MAGNESIUM GLUCONATE PO Take 400 mg by mouth. (Patient not taking: Reported on 12/25/2022)     rosuvastatin (CRESTOR) 10 MG tablet Take 10 mg by mouth daily.     UNABLE TO FIND Med Name: new chapter plant calcium bone  strength  Take 3 bid     No current facility-administered medications for this visit.    REVIEW OF SYSTEMS:    10 Point review of Systems was done is negative except as noted above.   PHYSICAL EXAMINATION: .BP 111/69 (BP Location: Left Arm, Patient Position: Sitting)   Pulse 81   Temp 98.3 F (36.8 C) (Oral)   Resp 16   Wt 99 lb 12.8 oz (45.3 kg)   SpO2 98%   BMI 20.16 kg/m  GENERAL:alert, in no acute distress and comfortable SKIN: no acute rashes, no significant lesions EYES: conjunctiva are pink and non-injected, sclera anicteric OROPHARYNX: MMM, no exudates, no oropharyngeal erythema or ulceration NECK: supple, no JVD LYMPH:  no palpable lymphadenopathy in the cervical, axillary or inguinal regions LUNGS: clear to auscultation b/l with normal respiratory effort HEART: regular rate & rhythm ABDOMEN:  normoactive bowel  sounds , non tender, not distended. Extremity: no pedal edema PSYCH: alert & oriented x 3 with fluent speech NEURO: no focal motor/sensory deficits   LABORATORY DATA:  I have reviewed the data as listed  .    Latest Ref Rng & Units 02/12/2023   11:45 AM 10/16/2022    9:09 AM 06/17/2022   11:15 AM  CBC  WBC 4.0 - 10.5 K/uL 6.2  6.8  6.8   Hemoglobin 12.0 - 15.0 g/dL 78.2  95.6  21.3   Hematocrit 36.0 - 46.0 % 34.4  36.2  35.4   Platelets 150 - 400 K/uL 84  97  104    .CBC    Component Value Date/Time   WBC 6.2 02/12/2023 1145   WBC 9.6 09/22/2021 1253   RBC 3.54 (L) 02/12/2023 1145   HGB 11.3 (L) 02/12/2023 1145   HCT 34.4 (L) 02/12/2023 1145   PLT 84 (L) 02/12/2023 1145   MCV 97.2 02/12/2023 1145   MCH 31.9 02/12/2023 1145   MCHC 32.8 02/12/2023 1145   RDW 16.3 (H) 02/12/2023 1145   LYMPHSABS 4.3 (H) 02/12/2023 1145   MONOABS 0.4 02/12/2023 1145   EOSABS 0.2 02/12/2023 1145   BASOSABS 0.0 02/12/2023 1145    .    Latest Ref Rng & Units 02/12/2023   11:45 AM 10/16/2022    9:09 AM 06/17/2022   11:15 AM  CMP  Glucose 70 - 99 mg/dL 98  086  94   BUN 8 - 23 mg/dL 18  18  15    Creatinine 0.44 - 1.00 mg/dL 5.78  4.69  6.29   Sodium 135 - 145 mmol/L 141  142  140   Potassium 3.5 - 5.1 mmol/L 4.4  3.8  4.3   Chloride 98 - 111 mmol/L 107  107  104   CO2 22 - 32 mmol/L 30  31  32   Calcium 8.9 - 10.3 mg/dL 8.7  9.3  9.5   Total Protein 6.5 - 8.1 g/dL 6.8  6.9  6.7   Total Bilirubin 0.3 - 1.2 mg/dL 0.5  0.5  0.4   Alkaline Phos 38 - 126 U/L 95  76  62   AST 15 - 41 U/L 35  34  33   ALT 0 - 44 U/L 22  18  20     . Lab Results  Component Value Date   LDH 188 02/12/2023      10/16/2022 Fish panel:  RADIOGRAPHIC STUDIES: I have personally reviewed the radiological images as listed and agreed with the findings in the report. No results found.  ASSESSMENT &  PLAN:   67 year old female with a history of previous pulmonary sarcoidosis, dyslipidemia, osteoporosis  with  #1  Recently diagnosed CLL/SLL based on biopsy of incidentally noted persistence left axillary lymphadenopathy on routine mammogram. Labs today showed lymphocytosis of 5.6k Clinically patient has a few small cervical lymph nodes and bilateral axillary lymph nodes.  #2 mild thrombocytopenia 102k  PLAN:.  -labs from July 2nd show platelet levels of 67K in the context of having a respiratory infection. -discussed lab results on 02/12/2023 in detail with patient. CBC showed WBC of 6.2K, hemoglobin of 11.3, and platelets decreased to 84K. -there is an overall trend of a gradual drop of platelets  -She is starting to develop anemia which may be related to infection or CLL -LDH pending -patient does meet some criteria to initiate treatment including low platelets, as it has dropped below 70K, enlarged spleen. Patient does have several lymph nodes though they are not bulky at this time.  -patient would like to continue monitoring disease without initiating treatment at this time -mutation testing shows no high risk mutations  -informed patient that 13q mutation is considered a better than average risk in regards to time to progression requiring treatment -informed patient that CLL increases the risk of non-melanoma skin cancers and 13q deletions is an additional risk factor. Recommend patient to continue to follow with dermatologist regularly -informed patient that Crestor may cause muscle cramping -abdominal bloating may be related to enlarged spleen, organ fluid, chronic constipation or anything else that may cause increased pressure in the abdomen -discussed that optimizing iron levels may improve muscle cramping -informed patient that B12 deficiencies may cause other B vitamins to become deficient -recommend OTC B complex supplement 2-3 times a week -continue to stay well-hydrated -okay to use liquid IV to improve muscle cramping -recommend sublingual vitamin B12 1000 mcg to optimize  vitamin B12 levels -discussed goal vit D levels of 60-90 -take 2000 units of vitamin D a day. -discussed options of CoQ10 or OTC Alpha-lipoic acid to improve muscle cramping  FOLLOW-UP: RTC with Dr Candise Che with labs in 14 weeks  The total time spent in the appointment was 25 minutes* .  All of the patient's questions were answered with apparent satisfaction. The patient knows to call the clinic with any problems, questions or concerns.   Wyvonnia Lora MD MS AAHIVMS [facility] Hematology/Oncology Physician [facility]  .*Total Encounter Time as defined by the Centers for Medicare and Medicaid Services includes, in addition to the face-to-face time of a patient visit (documented in the note above) non-face-to-face time: obtaining and reviewing outside history, ordering and reviewing medications, tests or procedures, care coordination (communications with other health care professionals or caregivers) and documentation in the medical record.    I,Mitra Faeizi,acting as a Neurosurgeon for Wyvonnia Lora, MD.,have documented all relevant documentation on the behalf of Wyvonnia Lora, MD,as directed by  Wyvonnia Lora, MD while in the presence of Wyvonnia Lora, MD.  .I have reviewed the above documentation for accuracy and completeness, and I agree with the above. Johney Maine MD

## 2023-03-03 DIAGNOSIS — M47816 Spondylosis without myelopathy or radiculopathy, lumbar region: Secondary | ICD-10-CM | POA: Diagnosis not present

## 2023-03-03 DIAGNOSIS — M545 Low back pain, unspecified: Secondary | ICD-10-CM | POA: Diagnosis not present

## 2023-03-03 DIAGNOSIS — M4316 Spondylolisthesis, lumbar region: Secondary | ICD-10-CM | POA: Diagnosis not present

## 2023-03-03 DIAGNOSIS — I700 Atherosclerosis of aorta: Secondary | ICD-10-CM | POA: Diagnosis not present

## 2023-03-03 DIAGNOSIS — M5416 Radiculopathy, lumbar region: Secondary | ICD-10-CM | POA: Diagnosis not present

## 2023-03-03 DIAGNOSIS — M25551 Pain in right hip: Secondary | ICD-10-CM | POA: Diagnosis not present

## 2023-03-11 ENCOUNTER — Other Ambulatory Visit (HOSPITAL_BASED_OUTPATIENT_CLINIC_OR_DEPARTMENT_OTHER): Payer: Self-pay | Admitting: Sports Medicine

## 2023-03-11 DIAGNOSIS — M5416 Radiculopathy, lumbar region: Secondary | ICD-10-CM

## 2023-03-12 ENCOUNTER — Ambulatory Visit (HOSPITAL_BASED_OUTPATIENT_CLINIC_OR_DEPARTMENT_OTHER)
Admission: RE | Admit: 2023-03-12 | Discharge: 2023-03-12 | Disposition: A | Discharge status: Home / Self Care | Payer: Medicare Other | Source: Ambulatory Visit | Attending: Sports Medicine | Admitting: Sports Medicine

## 2023-03-12 DIAGNOSIS — M48061 Spinal stenosis, lumbar region without neurogenic claudication: Secondary | ICD-10-CM | POA: Insufficient documentation

## 2023-03-12 DIAGNOSIS — M5126 Other intervertebral disc displacement, lumbar region: Secondary | ICD-10-CM | POA: Diagnosis not present

## 2023-03-12 DIAGNOSIS — C9110 Chronic lymphocytic leukemia of B-cell type not having achieved remission: Secondary | ICD-10-CM | POA: Diagnosis not present

## 2023-03-12 DIAGNOSIS — M4316 Spondylolisthesis, lumbar region: Secondary | ICD-10-CM | POA: Diagnosis not present

## 2023-03-12 DIAGNOSIS — M5136 Other intervertebral disc degeneration, lumbar region: Secondary | ICD-10-CM | POA: Diagnosis not present

## 2023-03-12 DIAGNOSIS — M5416 Radiculopathy, lumbar region: Secondary | ICD-10-CM | POA: Insufficient documentation

## 2023-03-15 DIAGNOSIS — R059 Cough, unspecified: Secondary | ICD-10-CM | POA: Diagnosis not present

## 2023-03-15 DIAGNOSIS — R152 Fecal urgency: Secondary | ICD-10-CM | POA: Diagnosis not present

## 2023-03-15 DIAGNOSIS — D696 Thrombocytopenia, unspecified: Secondary | ICD-10-CM | POA: Diagnosis not present

## 2023-03-15 DIAGNOSIS — Z1211 Encounter for screening for malignant neoplasm of colon: Secondary | ICD-10-CM | POA: Diagnosis not present

## 2023-03-19 DIAGNOSIS — M6281 Muscle weakness (generalized): Secondary | ICD-10-CM | POA: Diagnosis not present

## 2023-03-19 DIAGNOSIS — M25551 Pain in right hip: Secondary | ICD-10-CM | POA: Diagnosis not present

## 2023-03-19 DIAGNOSIS — M5416 Radiculopathy, lumbar region: Secondary | ICD-10-CM | POA: Diagnosis not present

## 2023-03-25 DIAGNOSIS — M25551 Pain in right hip: Secondary | ICD-10-CM | POA: Diagnosis not present

## 2023-03-25 DIAGNOSIS — M5416 Radiculopathy, lumbar region: Secondary | ICD-10-CM | POA: Diagnosis not present

## 2023-03-25 DIAGNOSIS — M6281 Muscle weakness (generalized): Secondary | ICD-10-CM | POA: Diagnosis not present

## 2023-03-29 DIAGNOSIS — M25551 Pain in right hip: Secondary | ICD-10-CM | POA: Diagnosis not present

## 2023-03-29 DIAGNOSIS — M5416 Radiculopathy, lumbar region: Secondary | ICD-10-CM | POA: Diagnosis not present

## 2023-03-29 DIAGNOSIS — M6281 Muscle weakness (generalized): Secondary | ICD-10-CM | POA: Diagnosis not present

## 2023-03-29 LAB — VITAMIN D 25 HYDROXY (VIT D DEFICIENCY, FRACTURES): Vit D, 25-Hydroxy: 50.7

## 2023-03-29 LAB — IRON,TIBC AND FERRITIN PANEL
%SAT: 8
Ferritin: 56.6
Iron: 46
TIBC: 497

## 2023-03-29 LAB — VITAMIN B12: Vitamin B-12: 184

## 2023-03-29 LAB — TSH: TSH: 4.97 (ref 0.41–5.90)

## 2023-04-01 DIAGNOSIS — M5416 Radiculopathy, lumbar region: Secondary | ICD-10-CM | POA: Diagnosis not present

## 2023-04-01 DIAGNOSIS — M25551 Pain in right hip: Secondary | ICD-10-CM | POA: Diagnosis not present

## 2023-04-01 DIAGNOSIS — M6281 Muscle weakness (generalized): Secondary | ICD-10-CM | POA: Diagnosis not present

## 2023-04-05 DIAGNOSIS — M6281 Muscle weakness (generalized): Secondary | ICD-10-CM | POA: Diagnosis not present

## 2023-04-05 DIAGNOSIS — M5416 Radiculopathy, lumbar region: Secondary | ICD-10-CM | POA: Diagnosis not present

## 2023-04-05 DIAGNOSIS — M25551 Pain in right hip: Secondary | ICD-10-CM | POA: Diagnosis not present

## 2023-04-08 DIAGNOSIS — M25551 Pain in right hip: Secondary | ICD-10-CM | POA: Diagnosis not present

## 2023-04-08 DIAGNOSIS — M6281 Muscle weakness (generalized): Secondary | ICD-10-CM | POA: Diagnosis not present

## 2023-04-08 DIAGNOSIS — M5416 Radiculopathy, lumbar region: Secondary | ICD-10-CM | POA: Diagnosis not present

## 2023-04-12 DIAGNOSIS — M25551 Pain in right hip: Secondary | ICD-10-CM | POA: Diagnosis not present

## 2023-04-12 DIAGNOSIS — M6281 Muscle weakness (generalized): Secondary | ICD-10-CM | POA: Diagnosis not present

## 2023-04-12 DIAGNOSIS — M5416 Radiculopathy, lumbar region: Secondary | ICD-10-CM | POA: Diagnosis not present

## 2023-04-15 DIAGNOSIS — M5416 Radiculopathy, lumbar region: Secondary | ICD-10-CM | POA: Diagnosis not present

## 2023-04-15 DIAGNOSIS — M489 Spondylopathy, unspecified: Secondary | ICD-10-CM | POA: Diagnosis not present

## 2023-04-16 DIAGNOSIS — M5416 Radiculopathy, lumbar region: Secondary | ICD-10-CM | POA: Diagnosis not present

## 2023-04-16 DIAGNOSIS — M6281 Muscle weakness (generalized): Secondary | ICD-10-CM | POA: Diagnosis not present

## 2023-04-16 DIAGNOSIS — M25551 Pain in right hip: Secondary | ICD-10-CM | POA: Diagnosis not present

## 2023-04-19 DIAGNOSIS — M5416 Radiculopathy, lumbar region: Secondary | ICD-10-CM | POA: Diagnosis not present

## 2023-04-19 DIAGNOSIS — M6281 Muscle weakness (generalized): Secondary | ICD-10-CM | POA: Diagnosis not present

## 2023-04-19 DIAGNOSIS — M25551 Pain in right hip: Secondary | ICD-10-CM | POA: Diagnosis not present

## 2023-04-22 DIAGNOSIS — M25551 Pain in right hip: Secondary | ICD-10-CM | POA: Diagnosis not present

## 2023-04-22 DIAGNOSIS — M5416 Radiculopathy, lumbar region: Secondary | ICD-10-CM | POA: Diagnosis not present

## 2023-04-22 DIAGNOSIS — M6281 Muscle weakness (generalized): Secondary | ICD-10-CM | POA: Diagnosis not present

## 2023-04-26 DIAGNOSIS — M6281 Muscle weakness (generalized): Secondary | ICD-10-CM | POA: Diagnosis not present

## 2023-04-26 DIAGNOSIS — M25551 Pain in right hip: Secondary | ICD-10-CM | POA: Diagnosis not present

## 2023-04-26 DIAGNOSIS — M5416 Radiculopathy, lumbar region: Secondary | ICD-10-CM | POA: Diagnosis not present

## 2023-04-29 DIAGNOSIS — M25551 Pain in right hip: Secondary | ICD-10-CM | POA: Diagnosis not present

## 2023-04-29 DIAGNOSIS — M6281 Muscle weakness (generalized): Secondary | ICD-10-CM | POA: Diagnosis not present

## 2023-04-29 DIAGNOSIS — M5416 Radiculopathy, lumbar region: Secondary | ICD-10-CM | POA: Diagnosis not present

## 2023-04-30 DIAGNOSIS — I700 Atherosclerosis of aorta: Secondary | ICD-10-CM | POA: Diagnosis not present

## 2023-04-30 DIAGNOSIS — C9110 Chronic lymphocytic leukemia of B-cell type not having achieved remission: Secondary | ICD-10-CM | POA: Diagnosis not present

## 2023-04-30 DIAGNOSIS — E7800 Pure hypercholesterolemia, unspecified: Secondary | ICD-10-CM | POA: Diagnosis not present

## 2023-04-30 DIAGNOSIS — E039 Hypothyroidism, unspecified: Secondary | ICD-10-CM | POA: Diagnosis not present

## 2023-04-30 DIAGNOSIS — E559 Vitamin D deficiency, unspecified: Secondary | ICD-10-CM | POA: Diagnosis not present

## 2023-04-30 DIAGNOSIS — M810 Age-related osteoporosis without current pathological fracture: Secondary | ICD-10-CM | POA: Diagnosis not present

## 2023-04-30 DIAGNOSIS — H269 Unspecified cataract: Secondary | ICD-10-CM | POA: Diagnosis not present

## 2023-04-30 DIAGNOSIS — R7303 Prediabetes: Secondary | ICD-10-CM | POA: Diagnosis not present

## 2023-04-30 DIAGNOSIS — Z1331 Encounter for screening for depression: Secondary | ICD-10-CM | POA: Diagnosis not present

## 2023-04-30 DIAGNOSIS — D696 Thrombocytopenia, unspecified: Secondary | ICD-10-CM | POA: Diagnosis not present

## 2023-04-30 DIAGNOSIS — Z0000 Encounter for general adult medical examination without abnormal findings: Secondary | ICD-10-CM | POA: Diagnosis not present

## 2023-05-01 LAB — LAB REPORT - SCANNED: EGFR: 76

## 2023-05-03 DIAGNOSIS — M5416 Radiculopathy, lumbar region: Secondary | ICD-10-CM | POA: Diagnosis not present

## 2023-05-03 DIAGNOSIS — M6281 Muscle weakness (generalized): Secondary | ICD-10-CM | POA: Diagnosis not present

## 2023-05-03 DIAGNOSIS — M25551 Pain in right hip: Secondary | ICD-10-CM | POA: Diagnosis not present

## 2023-05-06 DIAGNOSIS — D120 Benign neoplasm of cecum: Secondary | ICD-10-CM | POA: Diagnosis not present

## 2023-05-06 DIAGNOSIS — D122 Benign neoplasm of ascending colon: Secondary | ICD-10-CM | POA: Diagnosis not present

## 2023-05-06 DIAGNOSIS — K635 Polyp of colon: Secondary | ICD-10-CM | POA: Diagnosis not present

## 2023-05-06 DIAGNOSIS — Z860100 Personal history of colon polyps, unspecified: Secondary | ICD-10-CM | POA: Diagnosis not present

## 2023-05-07 DIAGNOSIS — M5416 Radiculopathy, lumbar region: Secondary | ICD-10-CM | POA: Diagnosis not present

## 2023-05-07 DIAGNOSIS — M25551 Pain in right hip: Secondary | ICD-10-CM | POA: Diagnosis not present

## 2023-05-07 DIAGNOSIS — M6281 Muscle weakness (generalized): Secondary | ICD-10-CM | POA: Diagnosis not present

## 2023-05-10 DIAGNOSIS — M25551 Pain in right hip: Secondary | ICD-10-CM | POA: Diagnosis not present

## 2023-05-10 DIAGNOSIS — M5416 Radiculopathy, lumbar region: Secondary | ICD-10-CM | POA: Diagnosis not present

## 2023-05-10 DIAGNOSIS — M6281 Muscle weakness (generalized): Secondary | ICD-10-CM | POA: Diagnosis not present

## 2023-05-11 DIAGNOSIS — H25812 Combined forms of age-related cataract, left eye: Secondary | ICD-10-CM | POA: Diagnosis not present

## 2023-05-11 DIAGNOSIS — H52213 Irregular astigmatism, bilateral: Secondary | ICD-10-CM | POA: Diagnosis not present

## 2023-05-11 DIAGNOSIS — H25813 Combined forms of age-related cataract, bilateral: Secondary | ICD-10-CM | POA: Diagnosis not present

## 2023-05-11 DIAGNOSIS — H524 Presbyopia: Secondary | ICD-10-CM | POA: Diagnosis not present

## 2023-05-13 DIAGNOSIS — M5416 Radiculopathy, lumbar region: Secondary | ICD-10-CM | POA: Diagnosis not present

## 2023-05-13 DIAGNOSIS — M6281 Muscle weakness (generalized): Secondary | ICD-10-CM | POA: Diagnosis not present

## 2023-05-13 DIAGNOSIS — M25551 Pain in right hip: Secondary | ICD-10-CM | POA: Diagnosis not present

## 2023-05-17 DIAGNOSIS — M25551 Pain in right hip: Secondary | ICD-10-CM | POA: Diagnosis not present

## 2023-05-17 DIAGNOSIS — M6281 Muscle weakness (generalized): Secondary | ICD-10-CM | POA: Diagnosis not present

## 2023-05-17 DIAGNOSIS — M5416 Radiculopathy, lumbar region: Secondary | ICD-10-CM | POA: Diagnosis not present

## 2023-05-20 DIAGNOSIS — M6281 Muscle weakness (generalized): Secondary | ICD-10-CM | POA: Diagnosis not present

## 2023-05-20 DIAGNOSIS — M25551 Pain in right hip: Secondary | ICD-10-CM | POA: Diagnosis not present

## 2023-05-20 DIAGNOSIS — M5416 Radiculopathy, lumbar region: Secondary | ICD-10-CM | POA: Diagnosis not present

## 2023-05-24 DIAGNOSIS — M6281 Muscle weakness (generalized): Secondary | ICD-10-CM | POA: Diagnosis not present

## 2023-05-24 DIAGNOSIS — M4316 Spondylolisthesis, lumbar region: Secondary | ICD-10-CM | POA: Diagnosis not present

## 2023-05-24 DIAGNOSIS — M25551 Pain in right hip: Secondary | ICD-10-CM | POA: Diagnosis not present

## 2023-05-24 DIAGNOSIS — M5416 Radiculopathy, lumbar region: Secondary | ICD-10-CM | POA: Diagnosis not present

## 2023-05-27 ENCOUNTER — Other Ambulatory Visit: Payer: Self-pay | Admitting: *Deleted

## 2023-05-27 DIAGNOSIS — M6281 Muscle weakness (generalized): Secondary | ICD-10-CM | POA: Diagnosis not present

## 2023-05-27 DIAGNOSIS — M25551 Pain in right hip: Secondary | ICD-10-CM | POA: Diagnosis not present

## 2023-05-27 DIAGNOSIS — C9110 Chronic lymphocytic leukemia of B-cell type not having achieved remission: Secondary | ICD-10-CM

## 2023-05-27 DIAGNOSIS — M5416 Radiculopathy, lumbar region: Secondary | ICD-10-CM | POA: Diagnosis not present

## 2023-05-28 ENCOUNTER — Inpatient Hospital Stay: Payer: Medicare Other | Attending: Hematology

## 2023-05-28 ENCOUNTER — Inpatient Hospital Stay (HOSPITAL_BASED_OUTPATIENT_CLINIC_OR_DEPARTMENT_OTHER): Payer: Medicare Other | Admitting: Hematology

## 2023-05-28 VITALS — BP 104/67 | HR 81 | Temp 99.0°F | Resp 17 | Wt 96.7 lb

## 2023-05-28 DIAGNOSIS — C9110 Chronic lymphocytic leukemia of B-cell type not having achieved remission: Secondary | ICD-10-CM | POA: Diagnosis not present

## 2023-05-28 DIAGNOSIS — Z79899 Other long term (current) drug therapy: Secondary | ICD-10-CM | POA: Diagnosis not present

## 2023-05-28 DIAGNOSIS — D696 Thrombocytopenia, unspecified: Secondary | ICD-10-CM | POA: Diagnosis not present

## 2023-05-28 DIAGNOSIS — R161 Splenomegaly, not elsewhere classified: Secondary | ICD-10-CM | POA: Diagnosis not present

## 2023-05-28 LAB — CMP (CANCER CENTER ONLY)
ALT: 27 U/L (ref 0–44)
AST: 45 U/L — ABNORMAL HIGH (ref 15–41)
Albumin: 3.8 g/dL (ref 3.5–5.0)
Alkaline Phosphatase: 93 U/L (ref 38–126)
Anion gap: 4 — ABNORMAL LOW (ref 5–15)
BUN: 17 mg/dL (ref 8–23)
CO2: 32 mmol/L (ref 22–32)
Calcium: 9.1 mg/dL (ref 8.9–10.3)
Chloride: 104 mmol/L (ref 98–111)
Creatinine: 0.78 mg/dL (ref 0.44–1.00)
GFR, Estimated: >60 mL/min (ref >60)
Glucose, Bld: 141 mg/dL — ABNORMAL HIGH (ref 70–99)
Potassium: 3.9 mmol/L (ref 3.5–5.1)
Sodium: 140 mmol/L (ref 135–145)
Total Bilirubin: 0.5 mg/dL (ref <1.2)
Total Protein: 6.8 g/dL (ref 6.5–8.1)

## 2023-05-28 LAB — CBC WITH DIFFERENTIAL (CANCER CENTER ONLY)
Abs Immature Granulocytes: 0.01 10*3/uL (ref 0.00–0.07)
Basophils Absolute: 0 10*3/uL (ref 0.0–0.1)
Basophils Relative: 1 %
Eosinophils Absolute: 0.1 10*3/uL (ref 0.0–0.5)
Eosinophils Relative: 2 %
HCT: 34.3 % — ABNORMAL LOW (ref 36.0–46.0)
Hemoglobin: 11.6 g/dL — ABNORMAL LOW (ref 12.0–15.0)
Immature Granulocytes: 0 %
Lymphocytes Relative: 73 %
Lymphs Abs: 4.4 10*3/uL — ABNORMAL HIGH (ref 0.7–4.0)
MCH: 32.7 pg (ref 26.0–34.0)
MCHC: 33.8 g/dL (ref 30.0–36.0)
MCV: 96.6 fL (ref 80.0–100.0)
Monocytes Absolute: 0.4 10*3/uL (ref 0.1–1.0)
Monocytes Relative: 6 %
Neutro Abs: 1.1 10*3/uL — ABNORMAL LOW (ref 1.7–7.7)
Neutrophils Relative %: 18 %
Platelet Count: 80 10*3/uL — ABNORMAL LOW (ref 150–400)
RBC: 3.55 MIL/uL — ABNORMAL LOW (ref 3.87–5.11)
RDW: 15.7 % — ABNORMAL HIGH (ref 11.5–15.5)
Smear Review: NORMAL
WBC Count: 6 10*3/uL (ref 4.0–10.5)
nRBC: 0 % (ref 0.0–0.2)

## 2023-05-28 LAB — LACTATE DEHYDROGENASE: LDH: 184 U/L (ref 98–192)

## 2023-05-28 NOTE — Progress Notes (Signed)
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 Patient notes that she previously has had history of a cyst in the right axilla in September 2019 for which she was seen by dermatologist and treated with doxycycline with resolution. She also reports having right axillary soft tissue infection in December 1987 which was treated with Duricef. Patient has a history of pulmonary sarcoidosis which was apparently diagnosed in 32 by Dr. Eliseo Gum in [place].  It has been inactive from 2005 to present.  Patient follows with Dr. Sherene Sires at [facility] pulmonology.  Patient has previously had abnormal mammograms about 6 months ago when she was noted to have an enlarged axillary lymph node in the left axilla which was thought to be possibly reactive given she had a Shingrix vaccine in the left arm on 12/18/2020. On repeat mammogram/ultrasound she was noted to have bilateral symmetric axillary lymphadenopathy and therefore a core needle biopsy was recommended  She had a core needle biopsy of her left axillary enlarged lymph node on 08/22/2021 which showed relatively monomorphous proliferation of small lymphoid cells characterized by high nuclear cytoplasmic ratio.  Predominance of B lymphocytes which have CD20, CD79 and CD23 positive as well as have coexpression of CD5.  No significant CD10 or cyclin D1  positivity.  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  Patient notes no fevers no chills no night sweats no unexpected weight loss.  No new skin rashes.  No new fatigue. Has noticed some small lymph nodes in the neck in addition to her axillary lymph nodes. No abdominal pain or distention. No new chest pain or shortness of breath. No recent change in bowel or bladder habits. No new bone pains.  Interval history  Rebecca Aguirre is here for her 15-month follow-up for continued evaluation and management of her CLL/SLL.   Patient was last seen by me on 02/12/2023 and complained of increased lymph nodes in right-sided neck, stable muscle cramping, severe sharp pain in the buttocks with deep soreness, early sense of fullness, and muscle cramping in hands.  Today, she reports that she has been feeling well over the last 3-4 months. Patient reports that she recently was seen by her functional medicine doctor and did receive some workup.   Patient has been taking 5000 mcg sublingual B12 replacement as well as 100 MG of vitamin B1. She is taking 5000 IU vitamin D3. She reports that she will be starting low-dose iron replacement and was told to take 5000 mg fish oil a day. She reports that she has been recommended by a different provider to take vitamin B6. She is on a probiotic. She reports that she was told to take CoQ10 due to being on statin. Patient reports that she has been taking 30 mcg thyroid replacement since October.   She denies any fever, chill, night  sweats, new/different back pain, or new lumps/bumps. Patient reports fluctuating neck lymph nodes.    She reports that she does not feel that her spleen is as enlarged compared to her last visit. She reports that she sometimes feels full early when eating.    Patient reports having a previous  sensitivity panel monitoring IgG levels and it was found that certain foods could cause inflammation. She reports flaxseed oil sensitivity. She reports that handful of cashews sometimes causes stomach discomfort.   Patient had a colonoscopy in October which showed grade II internal hemorrhoids. She reports that her hemorroids do not bleed. Her bowel habits have been normal.   She reports that she has endorsed lower back pain since July. Patient reports previous stiffness in her buttocks limiting her ability to climb stairs. She was referred to physical therapy for a few months and is almost finished.   MEDICAL HISTORY:  Past Medical History:  Diagnosis Date   CLL (chronic lymphocytic leukemia) (HCC)    Hirsutism    Hypercholesteremia    Osteoporosis 10/20/2016   T score -2.8    SURGICAL HISTORY: Past Surgical History:  Procedure Laterality Date   BASAL CELL CARCINOMA EXCISION  12/2015    SOCIAL HISTORY: Social History   Socioeconomic History   Marital status: Married    Spouse name: Not on file   Number of children: Not on file   Years of education: Not on file   Highest education level: Not on file  Occupational History   Not on file  Tobacco Use   Smoking status: Never   Smokeless tobacco: Never  Vaping Use   Vaping status: Never Used  Substance and Sexual Activity   Alcohol use: Not Currently   Drug use: No   Sexual activity: Not Currently    Birth control/protection: Post-menopausal    Comment: intercourse age 22, less than 5 sexual partners,des neg  Other Topics Concern   Not on file  Social History Narrative   Not on file   Social Determinants of Health   Financial Resource Strain: Not on file  Food Insecurity: Not on file  Transportation Needs: Not on file  Physical Activity: Not on file  Stress: Not on file  Social Connections: Not on file  Intimate Partner Violence: Not on file    FAMILY HISTORY: Family History  Problem Relation Age of Onset    Osteoporosis Mother    Memory loss Mother    Dementia Mother    Heart failure Father    Irritable bowel syndrome Brother   Sister was recently diagnosed with lymphoma  ALLERGIES:  is allergic to gabapentin.  MEDICATIONS:  Current Outpatient Medications  Medication Sig Dispense Refill   CALCIUM PO Take by mouth. occasional     Cholecalciferol (VITAMIN D PO) Take by mouth. occasional     MAGNESIUM GLUCONATE PO Take 400 mg by mouth. (Patient not taking: Reported on 12/25/2022)     rosuvastatin (CRESTOR) 10 MG tablet Take 10 mg by mouth daily.     UNABLE TO FIND Med Name: new chapter plant calcium bone strength  Take 3 bid     No current facility-administered medications for this visit.    REVIEW OF SYSTEMS:    10 Point review of Systems was done is negative except as noted above.   PHYSICAL EXAMINATION: .There were no vitals taken for this visit.   GENERAL:alert, in no acute distress and comfortable SKIN: no acute rashes, no significant lesions EYES: conjunctiva are pink and  non-injected, sclera anicteric OROPHARYNX: MMM, no exudates, no oropharyngeal erythema or ulceration NECK: supple, no JVD LYMPH:  no palpable lymphadenopathy in the cervical, axillary or inguinal regions LUNGS: clear to auscultation b/l with normal respiratory effort HEART: regular rate & rhythm ABDOMEN:  normoactive bowel sounds , non tender, not distended. Extremity: no pedal edema PSYCH: alert & oriented x 3 with fluent speech NEURO: no focal motor/sensory deficits    LABORATORY DATA:  I have reviewed the data as listed  .    Latest Ref Rng & Units 02/12/2023   11:45 AM 10/16/2022    9:09 AM 06/17/2022   11:15 AM  CBC  WBC 4.0 - 10.5 K/uL 6.2  6.8  6.8   Hemoglobin 12.0 - 15.0 g/dL 16.1  09.6  04.5   Hematocrit 36.0 - 46.0 % 34.4  36.2  35.4   Platelets 150 - 400 K/uL 84  97  104    .CBC    Component Value Date/Time   WBC 6.2 02/12/2023 1145   WBC 9.6 09/22/2021 1253   RBC 3.54 (L)  02/12/2023 1145   HGB 11.3 (L) 02/12/2023 1145   HCT 34.4 (L) 02/12/2023 1145   PLT 84 (L) 02/12/2023 1145   MCV 97.2 02/12/2023 1145   MCH 31.9 02/12/2023 1145   MCHC 32.8 02/12/2023 1145   RDW 16.3 (H) 02/12/2023 1145   LYMPHSABS 4.3 (H) 02/12/2023 1145   MONOABS 0.4 02/12/2023 1145   EOSABS 0.2 02/12/2023 1145   BASOSABS 0.0 02/12/2023 1145    .    Latest Ref Rng & Units 02/12/2023   11:45 AM 10/16/2022    9:09 AM 06/17/2022   11:15 AM  CMP  Glucose 70 - 99 mg/dL 98  409  94   BUN 8 - 23 mg/dL 18  18  15    Creatinine 0.44 - 1.00 mg/dL 8.11  9.14  7.82   Sodium 135 - 145 mmol/L 141  142  140   Potassium 3.5 - 5.1 mmol/L 4.4  3.8  4.3   Chloride 98 - 111 mmol/L 107  107  104   CO2 22 - 32 mmol/L 30  31  32   Calcium 8.9 - 10.3 mg/dL 8.7  9.3  9.5   Total Protein 6.5 - 8.1 g/dL 6.8  6.9  6.7   Total Bilirubin 0.3 - 1.2 mg/dL 0.5  0.5  0.4   Alkaline Phos 38 - 126 U/L 95  76  62   AST 15 - 41 U/L 35  34  33   ALT 0 - 44 U/L 22  18  20     . Lab Results  Component Value Date   LDH 188 02/12/2023      10/16/2022 Fish panel:  RADIOGRAPHIC STUDIES: I have personally reviewed the radiological images as listed and agreed with the findings in the report. No results found.  ASSESSMENT & PLAN:   67 year old female with a history of previous pulmonary sarcoidosis, dyslipidemia, osteoporosis with  #1  Recently diagnosed CLL/SLL based on biopsy of incidentally noted persistence left axillary lymphadenopathy on routine mammogram. Labs today showed lymphocytosis of 5.6k Clinically patient has a few small cervical lymph nodes and bilateral axillary lymph nodes.  #2 mild thrombocytopenia 102k  PLAN:.  -Discussed lab results on 05/28/23 in detail with patient. CBC showed WBC of 6.0K, hemoglobin of 11.6, and platelets of 80K. -her hgb has improved from 11.3 three months ago to 11.6 currently -Total WBCs normal -platelets are low, though stable -copper  level not significantly  high -Vitamin D level normal -vitamin B12 levels are low -optimizing vitamin B12 levels should improve platelets -recommend to take at least 1000 mcg B12 daily. Discussed that 5000 mcg may be excessive at some point.  -discussed that if B12 levels are too low, this could cause a drop in potassium drop or muscle cramps -her last imaging was 9 months ago and her spleen was sizable at 18.3 cm at that time. Patient did have findings of several lymph nodes, none of which were bulky at that time.  -will plan to repeat imaging early next year -her ferritin level is greater than 50, which is ideal. Patient's low iron saturation could be from inflammation or could be underestimated. Iron levels could be slightly optimized; okay to take small dose of oral iron 2-3 times a week.  -educated patient that when exceeding 2000 mg of fish oil daily, this can increase the risk of bleeding -Thyroid levels not necessarily low on recent labs -patient is not physiologically presenting as having hypothyroidism -discussed consideration to not excessively replace thyroid levels as her levels are not significantly low at this time. Discussed increased risks of palpitations, sleep issues, weight loss, etc.  -recommend taking B complex at least a few days a week -her spleen does appear to be enlarged based on physical examination -Patient does meet criteria to treat from an enlarged spleen and thrombocytopenia standpoint -discussed that her enlarged spleen could push on bowels and cause bloating -answered all of patient's questions in detail -will continue to monitor with labs in 3 months  FOLLOW-UP: CT neck/CAP in 10 weeks RTC with Dr Candise Che with labs in 3 months  The total time spent in the appointment was 30 minutes* .  All of the patient's questions were answered with apparent satisfaction. The patient knows to call the clinic with any problems, questions or concerns.   Wyvonnia Lora MD MS AAHIVMS [facility]  [facility] Hematology/Oncology Physician [facility]  .*Total Encounter Time as defined by the Centers for Medicare and Medicaid Services includes, in addition to the face-to-face time of a patient visit (documented in the note above) non-face-to-face time: obtaining and reviewing outside history, ordering and reviewing medications, tests or procedures, care coordination (communications with other health care professionals or caregivers) and documentation in the medical record.    I,Mitra Faeizi,acting as a Neurosurgeon for Wyvonnia Lora, MD.,have documented all relevant documentation on the behalf of Wyvonnia Lora, MD,as directed by  Wyvonnia Lora, MD while in the presence of Wyvonnia Lora, MD.  .I have reviewed the above documentation for accuracy and completeness, and I agree with the above. Johney Maine MD

## 2023-06-01 DIAGNOSIS — M25551 Pain in right hip: Secondary | ICD-10-CM | POA: Diagnosis not present

## 2023-06-01 DIAGNOSIS — M5416 Radiculopathy, lumbar region: Secondary | ICD-10-CM | POA: Diagnosis not present

## 2023-06-01 DIAGNOSIS — M6281 Muscle weakness (generalized): Secondary | ICD-10-CM | POA: Diagnosis not present

## 2023-06-07 DIAGNOSIS — M5416 Radiculopathy, lumbar region: Secondary | ICD-10-CM | POA: Diagnosis not present

## 2023-06-07 DIAGNOSIS — M6281 Muscle weakness (generalized): Secondary | ICD-10-CM | POA: Diagnosis not present

## 2023-06-07 DIAGNOSIS — M25551 Pain in right hip: Secondary | ICD-10-CM | POA: Diagnosis not present

## 2023-06-10 DIAGNOSIS — M6281 Muscle weakness (generalized): Secondary | ICD-10-CM | POA: Diagnosis not present

## 2023-06-10 DIAGNOSIS — M25551 Pain in right hip: Secondary | ICD-10-CM | POA: Diagnosis not present

## 2023-06-10 DIAGNOSIS — M5416 Radiculopathy, lumbar region: Secondary | ICD-10-CM | POA: Diagnosis not present

## 2023-07-05 LAB — LAB REPORT - SCANNED
Free T4: 0.98 ng/dL
TSH: 4.97 (ref 0.41–5.90)

## 2023-07-05 LAB — TSH: TSH: 3.07 (ref 0.41–5.90)

## 2023-07-05 LAB — IRON,TIBC AND FERRITIN PANEL
Iron: 62
TIBC: 455
UIBC: 393

## 2023-07-05 LAB — VITAMIN D 25 HYDROXY (VIT D DEFICIENCY, FRACTURES): Vit D, 25-Hydroxy: 109

## 2023-07-05 LAB — VITAMIN B12: Vitamin B-12: >2000

## 2023-07-19 DIAGNOSIS — H0014 Chalazion left upper eyelid: Secondary | ICD-10-CM | POA: Diagnosis not present

## 2023-07-26 DIAGNOSIS — H0014 Chalazion left upper eyelid: Secondary | ICD-10-CM | POA: Diagnosis not present

## 2023-07-26 DIAGNOSIS — H0288B Meibomian gland dysfunction left eye, upper and lower eyelids: Secondary | ICD-10-CM | POA: Diagnosis not present

## 2023-07-26 DIAGNOSIS — H0288A Meibomian gland dysfunction right eye, upper and lower eyelids: Secondary | ICD-10-CM | POA: Diagnosis not present

## 2023-08-03 DIAGNOSIS — H25812 Combined forms of age-related cataract, left eye: Secondary | ICD-10-CM | POA: Diagnosis not present

## 2023-08-03 DIAGNOSIS — H25813 Combined forms of age-related cataract, bilateral: Secondary | ICD-10-CM | POA: Diagnosis not present

## 2023-08-12 DIAGNOSIS — Z961 Presence of intraocular lens: Secondary | ICD-10-CM | POA: Diagnosis not present

## 2023-08-12 DIAGNOSIS — H25811 Combined forms of age-related cataract, right eye: Secondary | ICD-10-CM | POA: Diagnosis not present

## 2023-08-17 DIAGNOSIS — H2511 Age-related nuclear cataract, right eye: Secondary | ICD-10-CM | POA: Diagnosis not present

## 2023-08-17 DIAGNOSIS — H25811 Combined forms of age-related cataract, right eye: Secondary | ICD-10-CM | POA: Diagnosis not present

## 2023-08-17 DIAGNOSIS — H268 Other specified cataract: Secondary | ICD-10-CM | POA: Diagnosis not present

## 2023-08-23 ENCOUNTER — Other Ambulatory Visit: Payer: Self-pay

## 2023-08-23 DIAGNOSIS — C9110 Chronic lymphocytic leukemia of B-cell type not having achieved remission: Secondary | ICD-10-CM

## 2023-08-24 ENCOUNTER — Inpatient Hospital Stay: Payer: Medicare Other | Attending: Hematology

## 2023-08-24 ENCOUNTER — Inpatient Hospital Stay (HOSPITAL_BASED_OUTPATIENT_CLINIC_OR_DEPARTMENT_OTHER): Payer: Medicare Other | Admitting: Hematology

## 2023-08-24 VITALS — BP 108/65 | HR 86 | Temp 97.7°F | Resp 16 | Wt 95.4 lb

## 2023-08-24 DIAGNOSIS — R161 Splenomegaly, not elsewhere classified: Secondary | ICD-10-CM | POA: Insufficient documentation

## 2023-08-24 DIAGNOSIS — Z85828 Personal history of other malignant neoplasm of skin: Secondary | ICD-10-CM | POA: Diagnosis not present

## 2023-08-24 DIAGNOSIS — D696 Thrombocytopenia, unspecified: Secondary | ICD-10-CM | POA: Insufficient documentation

## 2023-08-24 DIAGNOSIS — C9110 Chronic lymphocytic leukemia of B-cell type not having achieved remission: Secondary | ICD-10-CM | POA: Diagnosis not present

## 2023-08-24 LAB — CBC WITH DIFFERENTIAL (CANCER CENTER ONLY)
Abs Immature Granulocytes: 0.01 10*3/uL (ref 0.00–0.07)
Basophils Absolute: 0 10*3/uL (ref 0.0–0.1)
Basophils Relative: 1 %
Eosinophils Absolute: 0.1 10*3/uL (ref 0.0–0.5)
Eosinophils Relative: 2 %
HCT: 38.5 % (ref 36.0–46.0)
Hemoglobin: 12.6 g/dL (ref 12.0–15.0)
Immature Granulocytes: 0 %
Lymphocytes Relative: 75 %
Lymphs Abs: 4.9 10*3/uL — ABNORMAL HIGH (ref 0.7–4.0)
MCH: 30.4 pg (ref 26.0–34.0)
MCHC: 32.7 g/dL (ref 30.0–36.0)
MCV: 93 fL (ref 80.0–100.0)
Monocytes Absolute: 0.4 10*3/uL (ref 0.1–1.0)
Monocytes Relative: 6 %
Neutro Abs: 1 10*3/uL — ABNORMAL LOW (ref 1.7–7.7)
Neutrophils Relative %: 16 %
Platelet Count: 78 10*3/uL — ABNORMAL LOW (ref 150–400)
RBC: 4.14 MIL/uL (ref 3.87–5.11)
RDW: 14.8 % (ref 11.5–15.5)
Smear Review: NORMAL
WBC Count: 6.5 10*3/uL (ref 4.0–10.5)
nRBC: 0 % (ref 0.0–0.2)

## 2023-08-24 LAB — CMP (CANCER CENTER ONLY)
ALT: 28 U/L (ref 0–44)
AST: 42 U/L — ABNORMAL HIGH (ref 15–41)
Albumin: 3.8 g/dL (ref 3.5–5.0)
Alkaline Phosphatase: 97 U/L (ref 38–126)
Anion gap: 5 (ref 5–15)
BUN: 17 mg/dL (ref 8–23)
CO2: 31 mmol/L (ref 22–32)
Calcium: 9.1 mg/dL (ref 8.9–10.3)
Chloride: 105 mmol/L (ref 98–111)
Creatinine: 0.79 mg/dL (ref 0.44–1.00)
GFR, Estimated: >60 mL/min (ref >60)
Glucose, Bld: 133 mg/dL — ABNORMAL HIGH (ref 70–99)
Potassium: 4.1 mmol/L (ref 3.5–5.1)
Sodium: 141 mmol/L (ref 135–145)
Total Bilirubin: 0.5 mg/dL (ref 0.0–1.2)
Total Protein: 6.5 g/dL (ref 6.5–8.1)

## 2023-08-24 LAB — LACTATE DEHYDROGENASE: LDH: 174 U/L (ref 98–192)

## 2023-08-24 NOTE — Progress Notes (Signed)
 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 Patient notes that she previously has had history of a cyst in the right axilla in September 2019 for which she was seen by dermatologist and treated with doxycycline with resolution. She also reports having right axillary soft tissue infection in December 1987 which was treated with Duricef. Patient has a history of pulmonary sarcoidosis which was apparently diagnosed in 9 by Dr. Eliseo Gum in [place].  It has been inactive from 2005 to present.  Patient follows with Dr. Sherene Sires at [facility] pulmonology.  Patient has previously had abnormal mammograms about 6 months ago when she was noted to have an enlarged axillary lymph node in the left axilla which was thought to be possibly reactive given she had a Shingrix vaccine in the left arm on 12/18/2020. On repeat mammogram/ultrasound she was noted to have bilateral symmetric axillary lymphadenopathy and therefore a core needle biopsy was recommended  She had a core needle biopsy of her left axillary enlarged lymph node on 08/22/2021 which showed relatively monomorphous proliferation of small lymphoid cells characterized by high nuclear cytoplasmic ratio.  Predominance of B lymphocytes which have CD20, CD79 and CD23 positive as well as have coexpression of CD5.  No significant CD10 or cyclin D1  positivity.  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  Patient notes no fevers no chills no night sweats no unexpected weight loss.  No new skin rashes.  No new fatigue. Has noticed some small lymph nodes in the neck in addition to her axillary lymph nodes. No abdominal pain or distention. No new chest pain or shortness of breath. No recent change in bowel or bladder habits. No new bone pains.  Interval history  Rebecca Aguirre is here for her 7-month follow-up for continued evaluation and management of her CLL/SLL.   Patient was last seen by me on 05/28/2023 and she reported fluctuating neck lymph nodes, occasional abdominal fullness, and lower back pain.   Patient notes she has been doing well overall since our last visit. She reports of occasional fatigue, but she notes that her energy has significantly improved in the past month.   Patient does report of stress due to personal issues with her husband and her family. Patient notes that she has filed for divorce. She notes that her 88 y/o son, who struggles with anxiety, currently lives with her. Her son is depended on her and her husband.   Patient does have a psychiatrist, but she is going to find a new psychiatrist.   She denies any new infection issues, fever, chills, SOB, night sweats, unexpected weight loss, back pain, chest pain, abdominal pain, or leg swelling. She reports of occasional palpitation when laying down and she regularly follows-up with her PCP.   She has discontinued Crestor 10 mg.   Patient notes she  had her second cataract surgery around 2-3 weeks ago, which went well without any complications.   Labs provided from the patient, from 07/05/2023, showed patient's copper levels were elevated. She denies taking any copper supplement.    MEDICAL HISTORY:  Past Medical  History:  Diagnosis Date   CLL (chronic lymphocytic leukemia) (HCC)    Hirsutism    Hypercholesteremia    Osteoporosis 10/20/2016   T score -2.8    SURGICAL HISTORY: Past Surgical History:  Procedure Laterality Date   BASAL CELL CARCINOMA EXCISION  12/2015    SOCIAL HISTORY: Social History   Socioeconomic History   Marital status: Married    Spouse name: Not on file   Number of children: Not on file   Years of education: Not on file   Highest education level: Not on file  Occupational History   Not on file  Tobacco Use   Smoking status: Never   Smokeless tobacco: Never  Vaping Use   Vaping status: Never Used  Substance and Sexual Activity   Alcohol use: Not Currently   Drug use: No   Sexual activity: Not Currently    Birth control/protection: Post-menopausal    Comment: intercourse age 72, less than 5 sexual partners,des neg  Other Topics Concern   Not on file  Social History Narrative   Not on file   Social Drivers of Health   Financial Resource Strain: Not on file  Food Insecurity: Not on file  Transportation Needs: Not on file  Physical Activity: Not on file  Stress: Not on file  Social Connections: Not on file  Intimate Partner Violence: Not on file    FAMILY HISTORY: Family History  Problem Relation Age of Onset   Osteoporosis Mother    Memory loss Mother    Dementia Mother    Heart failure Father    Irritable bowel syndrome Brother   Sister was recently diagnosed with lymphoma  ALLERGIES:  is allergic to gabapentin.  MEDICATIONS:  Current Outpatient Medications  Medication Sig Dispense Refill   Bacillus Coagulans-Inulin (PROBIOTIC-PREBIOTIC) 1-250 BILLION-MG CAPS Take by mouth.     CALCIUM PO Take by mouth. occasional     Cholecalciferol (VITAMIN D PO) Take by mouth. occasional     cyanocobalamin (VITAMIN B12) 1000 MCG tablet Take 1,000 mcg by mouth daily.     MAGNESIUM GLUCONATE PO Take 400 mg by mouth.     pyridoxine (B-6) 250 MG tablet  Take 250 mg by mouth daily.     rosuvastatin (CRESTOR) 10 MG tablet Take 10 mg by mouth daily.     thiamine (VITAMIN B-1) 100 MG tablet Take 100 mg by mouth daily.     thyroid (ARMOUR) 30 MG tablet Take 30 mg by mouth daily before breakfast.     UNABLE TO FIND Med Name: new chapter plant calcium bone strength  Take 3 bid     No current facility-administered medications for this visit.    REVIEW OF SYSTEMS:    10 Point review of Systems was done is negative except as noted above.   PHYSICAL EXAMINATION: .BP 108/65 (BP Location: Left Arm, Patient Position: Sitting)   Pulse 86   Temp 97.7 F (36.5 C) (Temporal)   Resp 16   Wt 95 lb 6.4 oz (43.3 kg)   SpO2 98%   BMI 19.27 kg/m   GENERAL:alert, in no acute distress and comfortable SKIN: no acute rashes, no significant lesions EYES: conjunctiva are pink and non-injected, sclera anicteric OROPHARYNX: MMM, no exudates,  no oropharyngeal erythema or ulceration NECK: supple, no JVD LYMPH:  no palpable lymphadenopathy in the cervical, axillary or inguinal regions LUNGS: clear to auscultation b/l with normal respiratory effort HEART: regular rate & rhythm ABDOMEN:  normoactive bowel sounds , non tender, not distended. Extremity: no pedal edema PSYCH: alert & oriented x 3 with fluent speech NEURO: no focal motor/sensory deficits    LABORATORY DATA:  I have reviewed the data as listed  .    Latest Ref Rng & Units 08/24/2023   11:43 AM 05/28/2023   11:34 AM 02/12/2023   11:45 AM  CBC  WBC 4.0 - 10.5 K/uL 6.5  6.0  6.2   Hemoglobin 12.0 - 15.0 g/dL 40.9  81.1  91.4   Hematocrit 36.0 - 46.0 % 38.5  34.3  34.4   Platelets 150 - 400 K/uL 78  80  84    .CBC    Component Value Date/Time   WBC 6.5 08/24/2023 1143   WBC 9.6 09/22/2021 1253   RBC 4.14 08/24/2023 1143   HGB 12.6 08/24/2023 1143   HCT 38.5 08/24/2023 1143   PLT 78 (L) 08/24/2023 1143   MCV 93.0 08/24/2023 1143   MCH 30.4 08/24/2023 1143   MCHC 32.7 08/24/2023 1143    RDW 14.8 08/24/2023 1143   LYMPHSABS 4.9 (H) 08/24/2023 1143   MONOABS 0.4 08/24/2023 1143   EOSABS 0.1 08/24/2023 1143   BASOSABS 0.0 08/24/2023 1143    .    Latest Ref Rng & Units 08/24/2023   11:43 AM 05/28/2023   11:34 AM 02/12/2023   11:45 AM  CMP  Glucose 70 - 99 mg/dL 782  956  98   BUN 8 - 23 mg/dL 17  17  18    Creatinine 0.44 - 1.00 mg/dL 2.13  0.86  5.78   Sodium 135 - 145 mmol/L 141  140  141   Potassium 3.5 - 5.1 mmol/L 4.1  3.9  4.4   Chloride 98 - 111 mmol/L 105  104  107   CO2 22 - 32 mmol/L 31  32  30   Calcium 8.9 - 10.3 mg/dL 9.1  9.1  8.7   Total Protein 6.5 - 8.1 g/dL 6.5  6.8  6.8   Total Bilirubin 0.0 - 1.2 mg/dL 0.5  0.5  0.5   Alkaline Phos 38 - 126 U/L 97  93  95   AST 15 - 41 U/L 42  45  35   ALT 0 - 44 U/L 28  27  22     . Lab Results  Component Value Date   LDH 174 08/24/2023      10/16/2022 Fish panel:  RADIOGRAPHIC STUDIES: I have personally reviewed the radiological images as listed and agreed with the findings in the report. No results found.  ASSESSMENT & PLAN:   68 year old female with a history of previous pulmonary sarcoidosis, dyslipidemia, osteoporosis with  #1  Recently diagnosed CLL/SLL based on biopsy of incidentally noted persistence left axillary lymphadenopathy on routine mammogram. Labs today showed lymphocytosis of 5.6k Clinically patient has a few small cervical lymph nodes and bilateral axillary lymph nodes.  #2 mild thrombocytopenia 78k  PLAN:. -Discussed lab results from today, 08/24/2023, in detail with the patient. CBC shows low platelets of 78 K. CMP stable. LDH pending.  -Discussed with the patient that we will schedule CT scan before our next visit. Patient agrees with the plan.  -Schedule CT scan in 3 months before our next visit in 4 months.  -  Discussed with the patient that we will add copper level to next lab.  -Patient does meet criteria to treat from an enlarged spleen and thrombocytopenia  standpoint -discussed that her enlarged spleen could push on bowels and cause bloating -answered all of patient's questions in detail -will continue to monitor with labs in 3 months  FOLLOW-UP: CT neck/CAP in 10 weeks RTC with Dr Candise Che with labs in 3 months   The total time spent in the appointment was 25 minutes* .  All of the patient's questions were answered with apparent satisfaction. The patient knows to call the clinic with any problems, questions or concerns.   Wyvonnia Lora MD MS AAHIVMS [facility] Hematology/Oncology Physician [facility]  .*Total Encounter Time as defined by the Centers for Medicare and Medicaid Services includes, in addition to the face-to-face time of a patient visit (documented in the note above) non-face-to-face time: obtaining and reviewing outside history, ordering and reviewing medications, tests or procedures, care coordination (communications with other health care professionals or caregivers) and documentation in the medical record.   I,Param Shah,acting as a Neurosurgeon for Wyvonnia Lora, MD.,have documented all relevant documentation on the behalf of Wyvonnia Lora, MD,as directed by  Wyvonnia Lora, MD while in the presence of Wyvonnia Lora, MD.  .I have reviewed the above documentation for accuracy and completeness, and I agree with the above. Johney Maine MD

## 2023-08-27 ENCOUNTER — Telehealth: Payer: Self-pay | Admitting: Hematology

## 2023-08-27 NOTE — Telephone Encounter (Signed)
 Spoke with patient confirming upcoming appointment

## 2023-09-20 ENCOUNTER — Encounter: Payer: Self-pay | Admitting: Hematology

## 2023-09-30 ENCOUNTER — Encounter: Payer: Self-pay | Admitting: Adult Health

## 2023-09-30 ENCOUNTER — Ambulatory Visit (INDEPENDENT_AMBULATORY_CARE_PROVIDER_SITE_OTHER): Admitting: Adult Health

## 2023-09-30 VITALS — BP 100/78 | HR 75 | Temp 97.8°F | Resp 18 | Ht 59.0 in | Wt 97.4 lb

## 2023-09-30 DIAGNOSIS — C9110 Chronic lymphocytic leukemia of B-cell type not having achieved remission: Secondary | ICD-10-CM | POA: Diagnosis not present

## 2023-09-30 DIAGNOSIS — Z7689 Persons encountering health services in other specified circumstances: Secondary | ICD-10-CM

## 2023-09-30 DIAGNOSIS — R7303 Prediabetes: Secondary | ICD-10-CM

## 2023-09-30 DIAGNOSIS — D696 Thrombocytopenia, unspecified: Secondary | ICD-10-CM | POA: Diagnosis not present

## 2023-09-30 DIAGNOSIS — E039 Hypothyroidism, unspecified: Secondary | ICD-10-CM | POA: Diagnosis not present

## 2023-09-30 DIAGNOSIS — E785 Hyperlipidemia, unspecified: Secondary | ICD-10-CM | POA: Diagnosis not present

## 2023-09-30 DIAGNOSIS — M810 Age-related osteoporosis without current pathological fracture: Secondary | ICD-10-CM | POA: Diagnosis not present

## 2023-09-30 DIAGNOSIS — Z131 Encounter for screening for diabetes mellitus: Secondary | ICD-10-CM

## 2023-09-30 MED ORDER — THYROID 15 MG PO TABS: 15.0000 mg | ORAL_TABLET | Freq: Every day | ORAL | Status: DC

## 2023-09-30 NOTE — Progress Notes (Signed)
 [facility]  Provider:  Kenard Gower DNP  Code Status:  Full Code  Goals of Care:      No data to display           Chief Complaint  Patient presents with   New Patient (Initial Visit)    Patient has concerns about cholesterol.    Discussed the use of AI scribe software for clinical note transcription with the patient, who gave verbal consent to proceed.  HPI: Patient is a 68 y.o. female seen today to establish care with [facility].  She was diagnosed with chronic lymphocytic leukemia (CLL) three years ago after a mammogram revealed enlarged lymph nodes under her armpits, leading to a needle biopsy. She experiences fatigue and weight loss, which she attributes to CLL. She has not started treatment yet and is monitoring for additional symptoms. She is currently under the care of an oncologist and has a CT scan with contrast scheduled for 09-Nov-2023, followed by a follow-up appointment in May.  She has a history of high copper levels in her blood, identified through blood work done by a functional medicine nutrition doctor. She is concerned about this finding and plans to have it re-evaluated by her oncologist. She also has a history of low platelet counts, with recent blood work showing a platelet count of 78, which is below the normal range. She experiences easy bruising, particularly on her hands.  She has osteoporosis and recently restarted calcium supplementation, taking Pure Encapsulations Calcium K/D, which includes vitamin D and K, at a dose of two tablets daily.  She has hypothyroidism, for which she takes Armour Thyroid, 30 mg and 15 mg daily, totaling 45 mg. She was previously on Synthroid but switched to Armour Thyroid as prescribed by a functional medicine doctor.  She has dyslipidemia and was previously on Crestor but stopped it in December due to leg cramps. She is interested in exploring alternatives to statins. She follows a diet that avoids foods causing inflammation,  as identified by a food sensitivity panel, and tries to maintain a healthy lifestyle by eating vegetables and avoiding fried foods.  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 She experiences leg cramps occasionally, which can last for hours or days, and sometimes attributes them to her footwear or physical activity. She has a history of cataract surgery on both eyes earlier this year, which temporarily halted her exercise routine.  She is under significant stress due to personal issues, including marital problems and caring for her son, who has high anxiety and does not work. She is retired and has two children, a daughter who lives in [place] and a son who lives with her. Her mother, aged 22, resides in assisted living and has dementia. Her father passed away in Nov 08, 2017 from heart failure.    Past Medical History:  Diagnosis Date   CLL (chronic lymphocytic leukemia) (HCC)    Hirsutism    Hypercholesteremia    Osteoporosis 10/20/2016   T score -2.8    Past Surgical History:  Procedure Laterality Date   BASAL CELL CARCINOMA EXCISION  12/2015    Allergies  Allergen Reactions   Gabapentin Nausea And Vomiting    Outpatient Encounter Medications as of 09/30/2023  Medication Sig   Bacillus Coagulans-Inulin (PROBIOTIC-PREBIOTIC) 1-250 BILLION-MG CAPS Take by mouth.   CALCIUM PO Take by mouth. occasional   Multiple Vitamin (MULTIVITAMIN) capsule Take 1 capsule by mouth daily.   Probiotic Product (PROBIOTIC + OMEGA-3  PO) Take 1,000 mg by mouth.   thyroid (ARMOUR) 15 MG tablet Take 15 mg by mouth daily.   thyroid (ARMOUR) 30 MG tablet Take 30 mg by mouth daily before breakfast.   UNABLE TO FIND Take 22 mg by mouth. Med Name: Raw iron 5x week   Cholecalciferol (VITAMIN D PO) Take by mouth. occasional   cyanocobalamin (VITAMIN B12) 1000 MCG tablet Take 1,000 mcg by mouth daily.   MAGNESIUM GLUCONATE PO Take 400 mg by mouth.   pyridoxine (B-6) 250 MG tablet  Take 250 mg by mouth daily.   rosuvastatin (CRESTOR) 10 MG tablet Take 10 mg by mouth daily.   thiamine (VITAMIN B-1) 100 MG tablet Take 100 mg by mouth daily.   UNABLE TO FIND Med Name: new chapter plant calcium bone strength  Take 3 bid (Patient not taking: Reported on 09/30/2023)   No facility-administered encounter medications on file as of 09/30/2023.    Review of Systems:  Review of Systems  Constitutional:  Negative for appetite change, chills, fatigue and fever.  HENT:  Negative for congestion, hearing loss, rhinorrhea and sore throat.   Eyes: Negative.   Respiratory:  Negative for cough, shortness of breath and wheezing.   Cardiovascular:  Negative for chest pain, palpitations and leg swelling.  Gastrointestinal:  Negative for abdominal pain, constipation, diarrhea, nausea and vomiting.  Genitourinary:  Negative for dysuria.  Musculoskeletal:  Negative for arthralgias, back pain and myalgias.  Skin:  Negative for color change, rash and wound.  Neurological:  Negative for dizziness, weakness and headaches.  Psychiatric/Behavioral:  Negative for behavioral problems. The patient is not nervous/anxious.     Health Maintenance  Topic Date Due   DTaP/Tdap/Td (1 - Tdap) Never done   Zoster Vaccines- Shingrix (1 of 2) Never done   COVID-19 Vaccine (3 - Pfizer risk series) 10/29/2019   Pneumonia Vaccine 3+ Years old (2 of 2 - PCV) 01/21/2021   INFLUENZA VACCINE  02/04/2023   Medicare Annual Wellness (AWV)  04/29/2024   MAMMOGRAM  01/26/2025   Colonoscopy  05/05/2033   DEXA SCAN  Completed   Hepatitis C Screening  Completed   HPV VACCINES  Aged Out    Physical Exam: Vitals:   09/29/23 1532  BP: 100/78  Pulse: 75  Resp: 18  Temp: 97.8 F (36.6 C)  SpO2: 98%  Weight: 97 lb 6.4 oz (44.2 kg)  Height: 4\' 11"  (1.499 m)   Body mass index is 19.67 kg/m. Physical Exam Constitutional:      Appearance: Normal appearance.  HENT:     Head: Normocephalic and atraumatic.      Nose: Nose normal.     Mouth/Throat:     Mouth: Mucous membranes are moist.  Eyes:     Conjunctiva/sclera: Conjunctivae normal.  Cardiovascular:     Rate and Rhythm: Normal rate and regular rhythm.  Pulmonary:     Effort: Pulmonary effort is normal.     Breath sounds: Normal breath sounds.  Abdominal:     General: Bowel sounds are normal.     Palpations: Abdomen is soft.  Musculoskeletal:        General: Normal range of motion.     Cervical back: Normal range of motion.  Skin:    General: Skin is warm and dry.  Neurological:     General: No focal deficit present.     Mental Status: She is alert and oriented to person, place, and time.  Psychiatric:  Mood and Affect: Mood normal.        Behavior: Behavior normal.        Thought Content: Thought content normal.        Judgment: Judgment normal.     Labs reviewed: Basic Metabolic Panel: Recent Labs    02/12/23 1145 05/28/23 1134 08/24/23 1143  NA 141 140 141  K 4.4 3.9 4.1  CL 107 104 105  CO2 30 32 31  GLUCOSE 98 141* 133*  BUN 18 17 17   CREATININE 0.79 0.78 0.79  CALCIUM 8.7* 9.1 9.1   Liver Function Tests: Recent Labs    02/12/23 1145 05/28/23 1134 08/24/23 1143  AST 35 45* 42*  ALT 22 27 28   ALKPHOS 95 93 97  BILITOT 0.5 0.5 0.5  PROT 6.8 6.8 6.5  ALBUMIN 3.8 3.8 3.8   No results for input(s): "LIPASE", "AMYLASE" in the last 8760 hours. No results for input(s): "AMMONIA" in the last 8760 hours. CBC: Recent Labs    02/12/23 1145 05/28/23 1134 08/24/23 1143  WBC 6.2 6.0 6.5  NEUTROABS 1.3* 1.1* 1.0*  HGB 11.3* 11.6* 12.6  HCT 34.4* 34.3* 38.5  MCV 97.2 96.6 93.0  PLT 84* 80* 78*   Lipid Panel: No results for input(s): "CHOL", "HDL", "LDLCALC", "TRIG", "CHOLHDL", "LDLDIRECT" in the last 8760 hours. No results found for: "HGBA1C"  Procedures since last visit: No results found.  Assessment/Plan  1. Encounter to establish care (Primary) - established care with [facility]  2. CLL (chronic  lymphocytic leukemia) (HCC) -  Fatigue and weight loss possibly related to CLL. Awaiting CT scan and oncologist follow-up. - Order CT scan with contrast of chest, abdomen, and neck on April 23. - Follow up with oncologist in May after CT scan results.  3. Thrombocytopenia (HCC) Lab Results  Component Value Date   PLT 78 (L) 08/24/2023    -  reports easy bruising, no significant bleeding. - Repeat CBC to monitor platelet levels.  4. Osteoporosis without current pathological fracture, unspecified osteoporosis type -  Taking Pure Encapsulations Calcium K/D supplement. - Continue Pure Encapsulations Calcium K/D supplement.  5. Dyslipidemia Lab Results  Component Value Date   CHOL 260 (H) 10/26/2017   HDL 55 10/26/2017   LDLCALC 175 (H) 10/26/2017   TRIG 157 (H) 10/26/2017   CHOLHDL 4.7 10/26/2017    -  Discontinued Crestor due to leg cramps. Discussed dietary management   6. Acquired hypothyroidism Lab Results  Component Value Date   TSH 2.31 10/26/2017    Managed with Armour Thyroid. Current provider will continue prescription. - Continue Armour Thyroid 30 mg and 15 mg daily. - Order TSH test to monitor thyroid function. - thyroid (ARMOUR THYROID) 15 MG tablet; Take 1 tablet (15 mg total) by mouth daily. Together with Armour thyroid 30 mg = 45 mg daily  7. Prediabetes -  diet-controlled -  A1C      General Health Maintenance Proactive about health maintenance. Discussed vaccine concerns. - Schedule mammogram. - Discuss potential benefits and risks of flu and pneumonia vaccines.    Labs/tests ordered:   CBC, lipid panel, A1C, tsh, , vitamin D level   Return in about 2 weeks (around 10/14/2023).  Kenard Gower, NP

## 2023-09-30 NOTE — Patient Instructions (Signed)
 Preventive Care 43 Years and Older, Female Preventive care refers to lifestyle choices and visits with your health care provider that can promote health and wellness. Preventive care visits are also called wellness exams. What can I expect for my preventive care visit? Counseling Your health care provider may ask you questions about your: Medical history, including: Past medical problems. Family medical history. Pregnancy and menstrual history. History of falls. Current health, including: Memory and ability to understand (cognition). Emotional well-being. Home life and relationship well-being. Sexual activity and sexual health. Lifestyle, including: Alcohol, nicotine or tobacco, and drug use. Access to firearms. Diet, exercise, and sleep habits. Work and work Astronomer. Sunscreen use. Safety issues such as seatbelt and bike helmet use. Physical exam Your health care provider will check your: Height and weight. These may be used to calculate your BMI (body mass index). BMI is a measurement that tells if you are at a healthy weight. Waist circumference. This measures the distance around your waistline. This measurement also tells if you are at a healthy weight and may help predict your risk of certain diseases, such as type 2 diabetes and high blood pressure. Heart rate and blood pressure. Body temperature. Skin for abnormal spots. What immunizations do I need?  Vaccines are usually given at various ages, according to a schedule. Your health care provider will recommend vaccines for you based on your age, medical history, and lifestyle or other factors, such as travel or where you work. What tests do I need? Screening Your health care provider may recommend screening tests for certain conditions. This may include: Lipid and cholesterol levels. Hepatitis C test. Hepatitis B test. HIV (human immunodeficiency virus) test. STI (sexually transmitted infection) testing, if you are at  risk. Lung cancer screening. Colorectal cancer screening. Diabetes screening. This is done by checking your blood sugar (glucose) after you have not eaten for a while (fasting). Mammogram. Talk with your health care provider about how often you should have regular mammograms. BRCA-related cancer screening. This may be done if you have a family history of breast, ovarian, tubal, or peritoneal cancers. Bone density scan. This is done to screen for osteoporosis. Talk with your health care provider about your test results, treatment options, and if necessary, the need for more tests. Follow these instructions at home: Eating and drinking  Eat a diet that includes fresh fruits and vegetables, whole grains, lean protein, and low-fat dairy products. Limit your intake of foods with high amounts of sugar, saturated fats, and salt. Take vitamin and mineral supplements as recommended by your health care provider. Do not drink alcohol if your health care provider tells you not to drink. If you drink alcohol: Limit how much you have to 0-1 drink a day. Know how much alcohol is in your drink. In the [place] one drink equals one 12 oz bottle of beer (355 mL), one 5 oz glass of wine (148 mL), or one 1 oz glass of hard liquor (44 mL). Lifestyle Brush your teeth every morning and night with fluoride toothpaste. Floss one time each day. Exercise for at least 30 minutes 5 or more days each week. Do not use any products that contain nicotine or tobacco. These products include cigarettes, chewing tobacco, and vaping devices, such as e-cigarettes. If you need help quitting, ask your health care provider. Do not use drugs. If you are sexually active, practice safe sex. Use a condom or other form of protection in order to prevent STIs. Take aspirin only as told by  your health care provider. Make sure that you understand how much to take and what form to take. Work with your health care provider to find out whether it  is safe and beneficial for you to take aspirin daily. Ask your health care provider if you need to take a cholesterol-lowering medicine (statin). Find healthy ways to manage stress, such as: Meditation, yoga, or listening to music. Journaling. Talking to a trusted person. Spending time with friends and family. Minimize exposure to UV radiation to reduce your risk of skin cancer. Safety Always wear your seat belt while driving or riding in a vehicle. Do not drive: If you have been drinking alcohol. Do not ride with someone who has been drinking. When you are tired or distracted. While texting. If you have been using any mind-altering substances or drugs. Wear a helmet and other protective equipment during sports activities. If you have firearms in your house, make sure you follow all gun safety procedures. What's next? Visit your health care provider once a year for an annual wellness visit. Ask your health care provider how often you should have your eyes and teeth checked. Stay up to date on all vaccines. This information is not intended to replace advice given to you by your health care provider. Make sure you discuss any questions you have with your health care provider. Document Revised: 12/18/2020 Document Reviewed: 12/18/2020 Elsevier Patient Education  2024 [employer].

## 2023-10-04 ENCOUNTER — Other Ambulatory Visit

## 2023-10-04 DIAGNOSIS — E039 Hypothyroidism, unspecified: Secondary | ICD-10-CM

## 2023-10-04 DIAGNOSIS — D696 Thrombocytopenia, unspecified: Secondary | ICD-10-CM

## 2023-10-04 DIAGNOSIS — R7303 Prediabetes: Secondary | ICD-10-CM | POA: Diagnosis not present

## 2023-10-04 DIAGNOSIS — M810 Age-related osteoporosis without current pathological fracture: Secondary | ICD-10-CM

## 2023-10-04 DIAGNOSIS — E785 Hyperlipidemia, unspecified: Secondary | ICD-10-CM

## 2023-10-05 LAB — CBC WITH DIFFERENTIAL/PLATELET
Absolute Lymphocytes: 5145 {cells}/uL — ABNORMAL HIGH (ref 850–3900)
Absolute Monocytes: 392 {cells}/uL (ref 200–950)
Basophils Absolute: 28 {cells}/uL (ref 0–200)
Basophils Relative: 0.4 %
Eosinophils Absolute: 70 {cells}/uL (ref 15–500)
Eosinophils Relative: 1 %
HCT: 37.6 % (ref 35.0–45.0)
Hemoglobin: 12.4 g/dL (ref 11.7–15.5)
MCH: 29.9 pg (ref 27.0–33.0)
MCHC: 33 g/dL (ref 32.0–36.0)
MCV: 90.6 fL (ref 80.0–100.0)
MPV: 11 fL (ref 7.5–12.5)
Monocytes Relative: 5.6 %
Neutro Abs: 1365 {cells}/uL — ABNORMAL LOW (ref 1500–7800)
Neutrophils Relative %: 19.5 %
Platelets: 91 10*3/uL — ABNORMAL LOW (ref 140–400)
RBC: 4.15 10*6/uL (ref 3.80–5.10)
RDW: 15.7 % — ABNORMAL HIGH (ref 11.0–15.0)
Total Lymphocyte: 73.5 %
WBC: 7 10*3/uL (ref 3.8–10.8)

## 2023-10-05 LAB — LIPID PANEL
Cholesterol: 200 mg/dL — ABNORMAL HIGH (ref <200)
HDL: 52 mg/dL (ref >=50)
LDL Cholesterol (Calc): 126 mg/dL — ABNORMAL HIGH
Non-HDL Cholesterol (Calc): 148 mg/dL — ABNORMAL HIGH (ref <130)
Total CHOL/HDL Ratio: 3.8 (calc) (ref <5.0)
Triglycerides: 111 mg/dL (ref <150)

## 2023-10-05 LAB — VITAMIN D 25 HYDROXY (VIT D DEFICIENCY, FRACTURES): Vit D, 25-Hydroxy: 75 ng/mL (ref 30–100)

## 2023-10-05 LAB — TSH: TSH: 1.66 m[IU]/L (ref 0.40–4.50)

## 2023-10-05 LAB — HEMOGLOBIN A1C
Hgb A1c MFr Bld: 5.9 %{Hb} — ABNORMAL HIGH (ref <5.7)
Mean Plasma Glucose: 123 mg/dL
eAG (mmol/L): 6.8 mmol/L

## 2023-10-14 NOTE — Progress Notes (Signed)
-    vitamin D level optimal -  cholesterol 200, improved from 260 -  LDL 126, improved from 175, recommend exercise for 150 minutes/week and low fat diet -  tsh normal -  A1c 5.9, ranging as prediabetic -  platelet 91, improved from 78, still low with normal level 140-400, follow up with oncology

## 2023-10-14 NOTE — Progress Notes (Signed)
-    A1C 5.9, ranging as prediabetic -  vitamin D is optimal -  platelet 91, improved from 78, still low, will need to be monitored -  tsh normal -  LDL 126, improved from 175 -  cholesterol 200, improved from 260, recommend to exercise at least 150 minutes/week and low fat diet

## 2023-10-15 ENCOUNTER — Ambulatory Visit: Admitting: Nurse Practitioner

## 2023-10-15 ENCOUNTER — Encounter: Payer: Self-pay | Admitting: Nurse Practitioner

## 2023-10-15 ENCOUNTER — Other Ambulatory Visit: Payer: Self-pay | Admitting: Nurse Practitioner

## 2023-10-15 VITALS — BP 118/72 | HR 76 | Temp 97.1°F | Ht 59.0 in | Wt 100.0 lb

## 2023-10-15 DIAGNOSIS — M545 Low back pain, unspecified: Secondary | ICD-10-CM

## 2023-10-15 DIAGNOSIS — C9110 Chronic lymphocytic leukemia of B-cell type not having achieved remission: Secondary | ICD-10-CM

## 2023-10-15 DIAGNOSIS — G8929 Other chronic pain: Secondary | ICD-10-CM

## 2023-10-15 DIAGNOSIS — M810 Age-related osteoporosis without current pathological fracture: Secondary | ICD-10-CM | POA: Diagnosis not present

## 2023-10-15 DIAGNOSIS — D869 Sarcoidosis, unspecified: Secondary | ICD-10-CM | POA: Diagnosis not present

## 2023-10-15 DIAGNOSIS — E785 Hyperlipidemia, unspecified: Secondary | ICD-10-CM

## 2023-10-15 DIAGNOSIS — E039 Hypothyroidism, unspecified: Secondary | ICD-10-CM

## 2023-10-15 MED ORDER — ATORVASTATIN CALCIUM 10 MG PO TABS: ORAL_TABLET | ORAL | 3 refills | Status: DC

## 2023-10-15 NOTE — Telephone Encounter (Signed)
 Copied from CRM [id number]. Topic: Clinical - Medication Refill >> Oct 15, 2023  3:12 PM Maryann Alar wrote: Most Recent Primary Care Visit:  Provider: Sharon Seller  Department: [facility]  Visit Type: OFFICE VISIT  Date: 10/15/2023  Medication: thyroid (ARMOUR THYROID) 15 MG tablet [[id number]]  Has the patient contacted their pharmacy? No (Agent: If no, request that the patient contact the pharmacy for the refill. If patient does not wish to contact the pharmacy document the reason why and proceed with request.) (Agent: If yes, when and what did the pharmacy advise?)  Is this the correct pharmacy for this prescription? Yes If no, delete pharmacy and type the correct one.  This is the patient's preferred pharmacy:  [id number] - [place], [place] - [address] [place] [address] Phone: [phone] Fax: [phone]   Has the prescription been filled recently? No  Is the patient out of the medication? No  Has the patient been seen for an appointment in the last year OR does the patient have an upcoming appointment? Yes  Can we respond through MyChart? Yes  Agent: Please be advised that Rx refills may take up to 3 business days. We ask that you follow-up with your pharmacy.
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
## 2023-10-15 NOTE — Patient Instructions (Signed)
 Take aleve twice daily for 3 days- take with food for low back. Continue heat

## 2023-10-18 MED ORDER — THYROID 15 MG PO TABS: 15.0000 mg | ORAL_TABLET | Freq: Every day | ORAL | 1 refills | Status: DC

## 2023-10-18 NOTE — Telephone Encounter (Signed)
Patient requested refill Pended and sent to Jessica for approval due to HIGH ALERT Warning.  

## 2023-10-22 ENCOUNTER — Other Ambulatory Visit: Payer: Self-pay | Admitting: Nurse Practitioner

## 2023-10-22 NOTE — Telephone Encounter (Deleted)
 Copied from CRM [id number]. Topic: Clinical - Medication Refill >> Oct 22, 2023 12:46 PM Deleta HERO wrote: Most Recent Primary Care Visit:  Provider: CARO HARLENE POUR  Department: [facility]  Visit Type: OFFICE VISIT  Date: 10/15/2023  Medication: thyroid  (ARMOUR) 30 MG tablet  Has the patient contacted their pharmacy? Yes (Agent: If no, request that the patient contact the pharmacy for the refill. If patient does not wish to contact the pharmacy document the reason why and proceed with request.) (Agent: If yes, when and what did the pharmacy advise?)  Is this the correct pharmacy for this prescription? Yes If no, delete pharmacy and type the correct one.  This is the patient's preferred pharmacy:  [id number] - [place], [place] - [address] [place] [address] Phone: [phone] Fax: [phone]   Has the prescription been filled recently? No, PT takes a total of 45 MG dosage of her Thyroid  prescription, she received the 15 MG but she will still need the 30 MG.   Is the patient out of the medication? Yes  Has the patient been seen for an appointment in the last year OR does the patient have an upcoming appointment? Yes  Can we respond through MyChart? Yes  Agent: Please be advised that Rx refills may take up to 3 business days. We ask that you follow-up with your pharmacy.

## 2023-10-22 NOTE — Telephone Encounter (Signed)
 Copied from CRM [id number]. Topic: Clinical - Prescription Issue >> Oct 22, 2023 12:48 PM Brynn Caras wrote: Reason for CRM: Rebecca Aguirre states she is a new-patient of [facility], she received a refill of thyroid  (ARMOUR THYROID ) 15 MG tablet but she is used to taking 45 MG dosage in total of this prescription. She will need a refill of Thyroid  30 MG  called in, attempted to complete Rx request but I received a "reorder" suggestion that states - thyroid  (ARMOUR) 30 MG tablet is not on the preferred formulary for the patient's insurance plan?

## 2023-10-25 MED ORDER — THYROID 30 MG PO TABS: 30.0000 mg | ORAL_TABLET | Freq: Every day | ORAL | 1 refills | Status: DC

## 2023-10-27 ENCOUNTER — Ambulatory Visit (HOSPITAL_COMMUNITY)
Admission: RE | Admit: 2023-10-27 | Discharge: 2023-10-27 | Disposition: A | Discharge status: Home / Self Care | Source: Ambulatory Visit | Attending: Hematology | Admitting: Hematology

## 2023-10-27 DIAGNOSIS — C9110 Chronic lymphocytic leukemia of B-cell type not having achieved remission: Secondary | ICD-10-CM | POA: Diagnosis not present

## 2023-10-27 DIAGNOSIS — D1809 Hemangioma of other sites: Secondary | ICD-10-CM | POA: Diagnosis not present

## 2023-10-27 DIAGNOSIS — D1803 Hemangioma of intra-abdominal structures: Secondary | ICD-10-CM | POA: Diagnosis not present

## 2023-10-27 DIAGNOSIS — R918 Other nonspecific abnormal finding of lung field: Secondary | ICD-10-CM | POA: Diagnosis not present

## 2023-10-27 MED ORDER — IOHEXOL 9 MG/ML PO SOLN
ORAL | Status: AC
  Filled 2023-10-27: qty 1000

## 2023-10-27 MED ORDER — IOHEXOL 300 MG/ML  SOLN
100.0000 mL | Freq: Once | INTRAMUSCULAR | Status: AC | PRN
  Administered 2023-10-27: 100 mL via INTRAVENOUS

## 2023-10-27 MED ORDER — SODIUM CHLORIDE (PF) 0.9 % IJ SOLN
INTRAMUSCULAR | Status: AC
  Filled 2023-10-27: qty 50

## 2023-10-27 MED ORDER — IOHEXOL 9 MG/ML PO SOLN
1000.0000 mL | Freq: Once | ORAL | Status: AC
  Administered 2023-10-27: 1000 mL via ORAL

## 2023-10-28 ENCOUNTER — Other Ambulatory Visit (HOSPITAL_COMMUNITY)
Admission: RE | Admit: 2023-10-28 | Discharge: 2023-10-28 | Disposition: A | Discharge status: Home / Self Care | Source: Ambulatory Visit | Attending: Obstetrics and Gynecology | Admitting: Obstetrics and Gynecology

## 2023-10-28 ENCOUNTER — Encounter: Payer: Self-pay | Admitting: Obstetrics and Gynecology

## 2023-10-28 ENCOUNTER — Ambulatory Visit (INDEPENDENT_AMBULATORY_CARE_PROVIDER_SITE_OTHER): Payer: Medicare Other | Admitting: Obstetrics and Gynecology

## 2023-10-28 VITALS — BP 110/64 | HR 68 | Ht 59.25 in | Wt 95.0 lb

## 2023-10-28 DIAGNOSIS — Z114 Encounter for screening for human immunodeficiency virus [HIV]: Secondary | ICD-10-CM

## 2023-10-28 DIAGNOSIS — Z9189 Other specified personal risk factors, not elsewhere classified: Secondary | ICD-10-CM

## 2023-10-28 DIAGNOSIS — Z113 Encounter for screening for infections with a predominantly sexual mode of transmission: Secondary | ICD-10-CM | POA: Diagnosis not present

## 2023-10-28 DIAGNOSIS — N952 Postmenopausal atrophic vaginitis: Secondary | ICD-10-CM | POA: Diagnosis not present

## 2023-10-28 DIAGNOSIS — Z1211 Encounter for screening for malignant neoplasm of colon: Secondary | ICD-10-CM

## 2023-10-28 DIAGNOSIS — Z7289 Other problems related to lifestyle: Secondary | ICD-10-CM | POA: Diagnosis not present

## 2023-10-28 DIAGNOSIS — M816 Localized osteoporosis [Lequesne]: Secondary | ICD-10-CM | POA: Diagnosis not present

## 2023-10-28 DIAGNOSIS — Z1231 Encounter for screening mammogram for malignant neoplasm of breast: Secondary | ICD-10-CM

## 2023-10-28 DIAGNOSIS — N898 Other specified noninflammatory disorders of vagina: Secondary | ICD-10-CM | POA: Diagnosis not present

## 2023-10-28 DIAGNOSIS — Z202 Contact with and (suspected) exposure to infections with a predominantly sexual mode of transmission: Secondary | ICD-10-CM

## 2023-10-28 DIAGNOSIS — Z7251 High risk heterosexual behavior: Secondary | ICD-10-CM | POA: Diagnosis not present

## 2023-10-28 DIAGNOSIS — Z124 Encounter for screening for malignant neoplasm of cervix: Secondary | ICD-10-CM

## 2023-10-28 NOTE — Progress Notes (Signed)
 68 y.o. y.o. female here for annual medicare GYN exam.  Husband of 30 years who she is trying to divorce possible infidelity has been caught with pornography addiction. She would like to have all sti testing and pap smear  No LMP recorded. Patient is postmenopausal.   G2P2L2 Married.  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   HPI: Menopause, well without hormone replacement therapy.  No postmenopausal bleeding.  No pelvic pain.  Abstinent.  No vaginal discharge.  Urine and bowel movements normal.  Breasts normal. Body mass index 19.89.  Physically active regularly.  Fasting Health labs with Fam MD.  BD Osteoporosis in 2018 and -2.7 in 2023. Repeat ordered. No medications. Counseled on evenity  and prolia. Patient would like to start this.  PA placed.. Has reflux does not want fosamax .Rebecca Aguirre 2015. Has enlarged spleen and CLL.  Body mass index is 19.03 kg/m.      10/15/2023    1:36 PM 09/30/2023   10:13 AM  Depression screen PHQ 2/9  Decreased Interest 0 0  Down, Depressed, Hopeless 0 0  PHQ - 2 Score 0 0  Altered sleeping 1   Tired, decreased energy 1   Change in appetite 0   Feeling bad or failure about yourself  0   Trouble concentrating 1   Moving slowly or fidgety/restless 0   Suicidal thoughts 0   PHQ-9 Score 3   Difficult doing work/chores Somewhat difficult     Height 4' 11.25" (1.505 m), weight 95 lb (43.1 kg).  No results found for: "DIAGPAP", "HPVHIGH", "ADEQPAP"  GYN HISTORY: No results found for: "DIAGPAP", "HPVHIGH", "ADEQPAP"  OB History  Gravida Para Term Preterm AB Living  2    0 2  SAB IAB Ectopic Multiple Live Births    0      # Outcome Date GA Lbr Len/2nd Weight Sex Type Anes PTL Lv  2 Gravida           1 Gravida             Past Medical History:  Diagnosis Date   Anemia    Arthritis    Cataract surgeries 1/28, 08/17/2023   CLL (chronic lymphocytic leukemia) (HCC)    Hirsutism    Hypercholesteremia    Osteoporosis 10/20/2016   T score  -2.8   Thyroid  disease     Past Surgical History:  Procedure Laterality Date   BASAL CELL CARCINOMA EXCISION  12/2015   CATARACT EXTRACTION, BILATERAL     08-03-23 & 08-17-23   CESAREAN SECTION  1991   TONSILLECTOMY AND ADENOIDECTOMY N/A 1960    Current Outpatient Medications on File Prior to Visit  Medication Sig Dispense Refill   atorvastatin  (LIPITOR) 10 MG tablet By mouth twice weekly 30 tablet 3   Bacillus Coagulans-Inulin (PROBIOTIC-PREBIOTIC) 1-250 BILLION-MG CAPS Take by mouth.     CALCIUM  PO Take 400 mg by mouth 2 (two) times daily. Includes Vit D 1000, K1 100 mcg, and K 2 30 mg)     co-enzyme Q-10 30 MG capsule Take 30 mg by mouth 3 (three) times daily.     Ferrous Sulfate (IRON PO) Take 22 mg by mouth as directed. RAW IRON OTC  5 times weekly     Multiple Vitamin (MULTIVITAMIN) capsule Take 1 capsule by mouth daily.     Omega-3 Fatty Acids (FISH OIL PO) Take 1,000 mg by mouth daily. Pro-Omega     thyroid  (ARMOUR THYROID ) 15 MG tablet Take 1 tablet (  15 mg total) by mouth daily. 90 tablet 1   thyroid  (ARMOUR) 30 MG tablet Take 1 tablet (30 mg total) by mouth daily before breakfast. Take with 15 mg tablet 90 tablet 1   No current facility-administered medications on file prior to visit.    Social History   Socioeconomic History   Marital status: Married    Spouse name: Not on file   Number of children: Not on file   Years of education: Not on file   Highest education level: Associate degree: occupational, Scientist, product/process development, or vocational program  Occupational History   Not on file  Tobacco Use   Smoking status: Never   Smokeless tobacco: Never  Vaping Use   Vaping status: Never Used  Substance and Sexual Activity   Alcohol use: Not Currently   Drug use: No   Sexual activity: Not Currently    Partners: Male    Birth control/protection: Post-menopausal    Comment: intercourse age 62, less than 5 sexual partners,des neg  Other Topics Concern   Not on file  Social  History Narrative   Not on file   Social Drivers of Health   Financial Resource Strain: Low Risk  (09/29/2023)   Overall Financial Resource Strain (CARDIA)    Difficulty of Paying Living Expenses: Not very hard  Food Insecurity: No Food Insecurity (09/29/2023)   Hunger Vital Sign    Worried About Running Out of Food in the Last Year: Never true    Ran Out of Food in the Last Year: Never true  Transportation Needs: No Transportation Needs (09/29/2023)   PRAPARE - Administrator, Civil Service (Medical): No    Lack of Transportation (Non-Medical): No  Physical Activity: Insufficiently Active (09/29/2023)   Exercise Vital Sign    Days of Exercise per Week: 2 days    Minutes of Exercise per Session: 20 min  Stress: No Stress Concern Present (09/29/2023)   Rebecca Aguirre    Feeling of Stress : Only a little  Social Connections: Socially Integrated (09/29/2023)   Social Connection and Isolation Panel [NHANES]    Frequency of Communication with Friends and Family: Twice a week    Frequency of Social Gatherings with Friends and Family: Once a week    Attends Religious Services: More than 4 times per year    Active Member of [employer] or Organizations: Yes    Attends Engineer, structural: More than 4 times per year    Marital Status: Married  Catering manager Violence: Not on file    Family History  Problem Relation Age of Onset   Osteoporosis Mother    Memory loss Mother    Dementia Mother    Heart failure Father    Irritable bowel syndrome Brother    Colon cancer Brother    Cancer Paternal Aunt        anal   Lung cancer Paternal Aunt      Allergies  Allergen Reactions   Crestor [Rosuvastatin]     Leg cramps    Gabapentin Nausea And Vomiting      Patient's last menstrual period was No LMP recorded. Patient is postmenopausal..             Review of Systems Alls systems reviewed and are  negative.     Physical Exam Constitutional:      Appearance: Normal appearance.  Genitourinary:     Vulva and urethral meatus normal.  No lesions in the vagina.     Right Labia: No rash, lesions or skin changes.    Left Labia: No lesions, skin changes or rash.    No vaginal discharge or tenderness.     No vaginal prolapse present.    Moderate vaginal atrophy present.     Right Adnexa: not tender, not palpable and no mass present.    Left Adnexa: not tender, not palpable and no mass present.    No cervical motion tenderness or discharge.     Uterus is not enlarged, tender or irregular.  Breasts:    Right: Normal.     Left: Normal.  HENT:     Head: Normocephalic.  Neck:     Thyroid : No thyroid  mass, thyromegaly or thyroid  tenderness.  Cardiovascular:     Rate and Rhythm: Normal rate and regular rhythm.     Heart sounds: Normal heart sounds, S1 normal and S2 normal.  Pulmonary:     Effort: Pulmonary effort is normal.     Breath sounds: Normal breath sounds and air entry.  Abdominal:     General: There is no distension.     Palpations: Abdomen is soft. There is no mass.     Tenderness: There is no abdominal tenderness. There is no guarding or rebound.  Musculoskeletal:        General: Normal range of motion.     Cervical back: Full passive range of motion without pain, normal range of motion and neck supple. No tenderness.     Right lower leg: No edema.     Left lower leg: No edema.  Neurological:     Mental Status: She is alert.  Skin:    General: Skin is warm.  Psychiatric:        Mood and Affect: Mood normal.        Behavior: Behavior normal.        Thought Content: Thought content normal.  Vitals and nursing note reviewed. Exam conducted with a chaperone present.   Rebecca Aguirre, Rebecca Aguirre was present for the entire exam    A:         Well Woman GYN exam, medicare high risk secondary to partner's possible infidelity                             P:        Pap smear  collected today Encouraged annual mammogram screening Colon cancer screening referral placed today DXA ordered today PA placed for evenity. Has severe osteoporosis. Known reflux and declines fosamax .  She would be open to follow with prolia.  Counseled on the r/b/a/I of the medications. Brochure was given. Gfr collected today. Vit D in normal range recently. Labs and immunizations to do with PMD Discussed breast self exams Encouraged healthy lifestyle practices Encouraged Vit D and Calcium    No follow-ups on file.  Rebecca Aguirre

## 2023-10-29 ENCOUNTER — Encounter: Payer: Self-pay | Admitting: Obstetrics and Gynecology

## 2023-10-29 LAB — SURESWAB® ADVANCED VAGINITIS PLUS,TMA
C. trachomatis RNA, TMA: NOT DETECTED
CANDIDA SPECIES: NOT DETECTED
Candida glabrata: NOT DETECTED
N. gonorrhoeae RNA, TMA: NOT DETECTED
SURESWAB(R) ADV BACTERIAL VAGINOSIS(BV),TMA: NEGATIVE
TRICHOMONAS VAGINALIS (TV),TMA: NOT DETECTED

## 2023-10-29 LAB — HEPATITIS C ANTIBODY: Hepatitis C Ab: NONREACTIVE

## 2023-10-29 LAB — HEPATITIS B SURFACE ANTIGEN: Hepatitis B Surface Ag: NONREACTIVE

## 2023-10-29 LAB — RPR: RPR Ser Ql: NONREACTIVE

## 2023-10-29 LAB — HIV ANTIBODY (ROUTINE TESTING W REFLEX): HIV 1&2 Ab, 4th Generation: NONREACTIVE

## 2023-10-30 LAB — COMPREHENSIVE METABOLIC PANEL WITH GFR
AG Ratio: 1.3 (calc) (ref 1.0–2.5)
ALT: 42 U/L — ABNORMAL HIGH (ref 6–29)
AST: 60 U/L — ABNORMAL HIGH (ref 10–35)
Albumin: 3.9 g/dL (ref 3.6–5.1)
Alkaline phosphatase (APISO): 105 U/L (ref 37–153)
BUN: 15 mg/dL (ref 7–25)
CO2: 30 mmol/L (ref 20–32)
Calcium: 9.6 mg/dL (ref 8.6–10.4)
Chloride: 100 mmol/L (ref 98–110)
Creat: 0.69 mg/dL (ref 0.50–1.05)
Globulin: 2.9 g/dL (ref 1.9–3.7)
Glucose, Bld: 96 mg/dL (ref 65–99)
Potassium: 4.5 mmol/L (ref 3.5–5.3)
Sodium: 140 mmol/L (ref 135–146)
Total Bilirubin: 0.5 mg/dL (ref 0.2–1.2)
Total Protein: 6.8 g/dL (ref 6.1–8.1)
eGFR: 94 mL/min/{1.73_m2} (ref >=60)

## 2023-11-01 ENCOUNTER — Encounter: Payer: Self-pay | Admitting: Obstetrics and Gynecology

## 2023-11-01 ENCOUNTER — Encounter: Payer: Self-pay | Admitting: Nurse Practitioner

## 2023-11-01 LAB — CYTOLOGY - PAP: Diagnosis: NEGATIVE

## 2023-11-02 NOTE — Telephone Encounter (Signed)
Message routed to PCP Eubanks, Jessica K, NP  

## 2023-11-10 ENCOUNTER — Other Ambulatory Visit: Payer: Self-pay | Admitting: *Deleted

## 2023-11-10 DIAGNOSIS — M810 Age-related osteoporosis without current pathological fracture: Secondary | ICD-10-CM

## 2023-11-10 MED ORDER — ROMOSOZUMAB-AQQG 105 MG/1.17ML ~~LOC~~ SOSY: 210.0000 mg | PREFILLED_SYRINGE | Freq: Once | SUBCUTANEOUS | Status: DC

## 2023-11-11 DIAGNOSIS — H00024 Hordeolum internum left upper eyelid: Secondary | ICD-10-CM | POA: Diagnosis not present

## 2023-11-18 ENCOUNTER — Encounter: Payer: Self-pay | Admitting: Hematology

## 2023-11-22 ENCOUNTER — Other Ambulatory Visit: Payer: Self-pay

## 2023-11-22 DIAGNOSIS — C9110 Chronic lymphocytic leukemia of B-cell type not having achieved remission: Secondary | ICD-10-CM

## 2023-11-23 ENCOUNTER — Inpatient Hospital Stay: Payer: Medicare Other | Attending: Hematology | Admitting: Hematology

## 2023-11-23 ENCOUNTER — Inpatient Hospital Stay: Payer: Medicare Other

## 2023-11-23 VITALS — BP 99/60 | HR 74 | Temp 97.7°F | Resp 18 | Wt 97.8 lb

## 2023-11-23 DIAGNOSIS — M810 Age-related osteoporosis without current pathological fracture: Secondary | ICD-10-CM | POA: Diagnosis not present

## 2023-11-23 DIAGNOSIS — C9110 Chronic lymphocytic leukemia of B-cell type not having achieved remission: Secondary | ICD-10-CM

## 2023-11-23 DIAGNOSIS — R161 Splenomegaly, not elsewhere classified: Secondary | ICD-10-CM

## 2023-11-23 DIAGNOSIS — Z800 Family history of malignant neoplasm of digestive organs: Secondary | ICD-10-CM | POA: Diagnosis not present

## 2023-11-23 DIAGNOSIS — Z801 Family history of malignant neoplasm of trachea, bronchus and lung: Secondary | ICD-10-CM | POA: Diagnosis not present

## 2023-11-23 DIAGNOSIS — Z85828 Personal history of other malignant neoplasm of skin: Secondary | ICD-10-CM | POA: Diagnosis not present

## 2023-11-23 DIAGNOSIS — D696 Thrombocytopenia, unspecified: Secondary | ICD-10-CM | POA: Diagnosis not present

## 2023-11-23 LAB — CMP (CANCER CENTER ONLY)
ALT: 39 U/L (ref 0–44)
AST: 59 U/L — ABNORMAL HIGH (ref 15–41)
Albumin: 3.9 g/dL (ref 3.5–5.0)
Alkaline Phosphatase: 107 U/L (ref 38–126)
Anion gap: 4 — ABNORMAL LOW (ref 5–15)
BUN: 22 mg/dL (ref 8–23)
CO2: 31 mmol/L (ref 22–32)
Calcium: 9.3 mg/dL (ref 8.9–10.3)
Chloride: 103 mmol/L (ref 98–111)
Creatinine: 0.73 mg/dL (ref 0.44–1.00)
GFR, Estimated: >60 mL/min (ref >60)
Glucose, Bld: 97 mg/dL (ref 70–99)
Potassium: 4.3 mmol/L (ref 3.5–5.1)
Sodium: 138 mmol/L (ref 135–145)
Total Bilirubin: 0.5 mg/dL (ref 0.0–1.2)
Total Protein: 6.9 g/dL (ref 6.5–8.1)

## 2023-11-23 LAB — CBC WITH DIFFERENTIAL (CANCER CENTER ONLY)
Abs Immature Granulocytes: 0.02 10*3/uL (ref 0.00–0.07)
Basophils Absolute: 0 10*3/uL (ref 0.0–0.1)
Basophils Relative: 1 %
Eosinophils Absolute: 0.1 10*3/uL (ref 0.0–0.5)
Eosinophils Relative: 1 %
HCT: 37.5 % (ref 36.0–46.0)
Hemoglobin: 12.8 g/dL (ref 12.0–15.0)
Immature Granulocytes: 0 %
Lymphocytes Relative: 73 %
Lymphs Abs: 4.8 10*3/uL — ABNORMAL HIGH (ref 0.7–4.0)
MCH: 30.5 pg (ref 26.0–34.0)
MCHC: 34.1 g/dL (ref 30.0–36.0)
MCV: 89.5 fL (ref 80.0–100.0)
Monocytes Absolute: 0.4 10*3/uL (ref 0.1–1.0)
Monocytes Relative: 6 %
Neutro Abs: 1.3 10*3/uL — ABNORMAL LOW (ref 1.7–7.7)
Neutrophils Relative %: 19 %
Platelet Count: 87 10*3/uL — ABNORMAL LOW (ref 150–400)
RBC: 4.19 MIL/uL (ref 3.87–5.11)
RDW: 15.7 % — ABNORMAL HIGH (ref 11.5–15.5)
WBC Count: 6.6 10*3/uL (ref 4.0–10.5)
nRBC: 0 % (ref 0.0–0.2)

## 2023-11-23 LAB — LACTATE DEHYDROGENASE: LDH: 202 U/L — ABNORMAL HIGH (ref 98–192)

## 2023-11-23 NOTE — Progress Notes (Signed)
 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 Patient notes that she previously has had history of a cyst in the right axilla in September 2019 for which she was seen by dermatologist and treated with doxycycline with resolution. She also reports having right axillary soft tissue infection in December 1987 which was treated with Duricef. Patient has a history of pulmonary sarcoidosis which was apparently diagnosed in 83 by Dr. Patton Borer in [place] .  It has been inactive from 2005 to present.  Patient follows with Dr. Waymond Hailey at [facility] pulmonology.  Patient has previously had abnormal mammograms about 6 months ago when she was noted to have an enlarged axillary lymph node in the left axilla which was thought to be possibly reactive given she had a Shingrix vaccine in the left arm on 12/18/2020. On repeat mammogram/ultrasound she was noted to have bilateral symmetric axillary lymphadenopathy and therefore a core needle biopsy was recommended  She had a core needle biopsy of her left axillary enlarged lymph node on 08/22/2021 which showed relatively monomorphous proliferation of small lymphoid cells characterized by high nuclear cytoplasmic ratio.  Predominance of B lymphocytes which have CD20, CD79 and CD23 positive as well as have coexpression of CD5.  No significant CD10 or  cyclin D1 positivity.  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  Patient notes no fevers no chills no night sweats no unexpected weight loss.  No new skin rashes.  No new fatigue. Has noticed some small lymph nodes in the neck in addition to her axillary lymph nodes. No abdominal pain or distention. No new chest pain or shortness of breath. No recent change in bowel or bladder habits. No new bone pains.  Interval history  Rebecca Aguirre is here for her 23-month follow-up for continued evaluation and management of her CLL/SLL.   Patient was last seen by me on 08/24/2023 and reported occasional fatigue stress due to personal issues, and occasional palpitation when laying down  Review of follow-up of her CLL.  She notes that some of the lymph nodes around the neck appeared to have increased in size.  She also notes abdominal fullness and early satiety likely due to her enlarged spleen. She notes that she is undergoing some social appeal since she has decided to file for divorce, her husband. We discussed all her available labs and CT CAP and neck in details.   MEDICAL HISTORY:  Past Medical History:  Diagnosis Date   Anemia    Arthritis    Cataract surgeries 1/28, 08/17/2023   CLL (chronic lymphocytic leukemia) (HCC)    Hirsutism    Hypercholesteremia    Osteoporosis 10/20/2016   T score -2.8   Thyroid  disease     SURGICAL HISTORY: Past Surgical History:  Procedure Laterality Date   BASAL CELL CARCINOMA EXCISION  12/2015  CATARACT EXTRACTION, BILATERAL     08-03-23 & 08-17-23   CESAREAN SECTION  1991   TONSILLECTOMY AND ADENOIDECTOMY N/A 1960    SOCIAL HISTORY: Social History   Socioeconomic History   Marital status: Married    Spouse name: Not on file   Number of children: Not on file   Years of education: Not on file    Highest education level: Associate degree: occupational, Scientist, product/process development, or vocational program  Occupational History   Not on file  Tobacco Use   Smoking status: Never   Smokeless tobacco: Never  Vaping Use   Vaping status: Never Used  Substance and Sexual Activity   Alcohol use: Not Currently   Drug use: No   Sexual activity: Not Currently    Partners: Male    Birth control/protection: Post-menopausal    Comment: intercourse age 9, less than 5 sexual partners,des neg  Other Topics Concern   Not on file  Social History Narrative   Not on file   Social Drivers of Health   Financial Resource Strain: Low Risk  (09/29/2023)   Overall Financial Resource Strain (CARDIA)    Difficulty of Paying Living Expenses: Not very hard  Food Insecurity: No Food Insecurity (09/29/2023)   Hunger Vital Sign    Worried About Running Out of Food in the Last Year: Never true    Ran Out of Food in the Last Year: Never true  Transportation Needs: No Transportation Needs (09/29/2023)   PRAPARE - Administrator, Civil Service (Medical): No    Lack of Transportation (Non-Medical): No  Physical Activity: Insufficiently Active (09/29/2023)   Exercise Vital Sign    Days of Exercise per Week: 2 days    Minutes of Exercise per Session: 20 min  Stress: No Stress Concern Present (09/29/2023)   [employer] of Occupational Health - Occupational Stress Questionnaire    Feeling of Stress : Only a little  Social Connections: Socially Integrated (09/29/2023)   Social Connection and Isolation Panel [NHANES]    Frequency of Communication with Friends and Family: Twice a week    Frequency of Social Gatherings with Friends and Family: Once a week    Attends Religious Services: More than 4 times per year    Active Member of [employer] or Organizations: Yes    Attends Engineer, structural: More than 4 times per year    Marital Status: Married  Catering manager Violence: Not on file    FAMILY  HISTORY: Family History  Problem Relation Age of Onset   Osteoporosis Mother    Memory loss Mother    Dementia Mother    Heart failure Father    Irritable bowel syndrome Brother    Colon cancer Brother    Cancer Paternal Aunt        anal   Lung cancer Paternal Aunt   Sister was recently diagnosed with lymphoma  ALLERGIES:  is allergic to crestor [rosuvastatin] and gabapentin.  MEDICATIONS:  Current Outpatient Medications  Medication Sig Dispense Refill   atorvastatin  (LIPITOR) 10 MG tablet By mouth twice weekly 30 tablet 3   Bacillus Coagulans-Inulin (PROBIOTIC-PREBIOTIC) 1-250 BILLION-MG CAPS Take by mouth.     CALCIUM  PO Take 400 mg by mouth 2 (two) times daily. Includes Vit D 1000, K1 100 mcg, and K 2 30 mg)     co-enzyme Q-10 30 MG capsule Take 30 mg by mouth 3 (three) times daily.     Ferrous Sulfate (IRON PO) Take 22 mg by mouth  as directed. RAW IRON OTC  5 times weekly     Multiple Vitamin (MULTIVITAMIN) capsule Take 1 capsule by mouth daily.     Omega-3 Fatty Acids (FISH OIL PO) Take 1,000 mg by mouth daily. Pro-Omega     thyroid  (ARMOUR THYROID ) 15 MG tablet Take 1 tablet (15 mg total) by mouth daily. 90 tablet 1   thyroid  (ARMOUR) 30 MG tablet Take 1 tablet (30 mg total) by mouth daily before breakfast. Take with 15 mg tablet 90 tablet 1   Current Facility-Administered Medications  Medication Dose Route Frequency Provider Last Rate Last Admin   [START ON 11/24/2023] Romosozumab -aqqg (EVENITY ) 105 MG/1. injection 210 mg  210 mg Subcutaneous Once Reinaldo Caras, MD        REVIEW OF SYSTEMS:    10 Point review of Systems was done is negative except as noted above.   PHYSICAL EXAMINATION: .BP 99/60   Pulse 74   Temp 97.7 F (36.5 C)   Resp 18   Wt 97 lb 12.8 oz (44.4 kg)   SpO2 97%   BMI 19.59 kg/m  GENERAL:alert, in no acute distress and comfortable SKIN: no acute rashes, no significant lesions EYES: conjunctiva are pink and non-injected, sclera  anicteric OROPHARYNX: MMM, no exudates, no oropharyngeal erythema or ulceration NECK: supple, no JVD LYMPH:  no palpable lymphadenopathy in the cervical, axillary or inguinal regions LUNGS: clear to auscultation b/l with normal respiratory effort HEART: regular rate & rhythm ABDOMEN:  normoactive bowel sounds , non tender, not distended. Extremity: no pedal edema PSYCH: alert & oriented x 3 with fluent speech NEURO: no focal motor/sensory deficits   LABORATORY DATA:  I have reviewed the data as listed  .    Latest Ref Rng & Units 11/23/2023   11:35 AM 10/04/2023   10:48 AM 08/24/2023   11:43 AM  CBC  WBC 4.0 - 10.5 K/uL 6.6  7.0  6.5   Hemoglobin 12.0 - 15.0 g/dL 54.2  70.6  23.7   Hematocrit 36.0 - 46.0 % 37.5  37.6  38.5   Platelets 150 - 400 K/uL 87  91  78    .CBC    Component Value Date/Time   WBC 6.6 11/23/2023 1135   WBC 7.0 10/04/2023 1048   RBC 4.19 11/23/2023 1135   HGB 12.8 11/23/2023 1135   HCT 37.5 11/23/2023 1135   PLT 87 (L) 11/23/2023 1135   MCV 89.5 11/23/2023 1135   MCH 30.5 11/23/2023 1135   MCHC 34.1 11/23/2023 1135   RDW 15.7 (H) 11/23/2023 1135   LYMPHSABS 4.8 (H) 11/23/2023 1135   MONOABS 0.4 11/23/2023 1135   EOSABS 0.1 11/23/2023 1135   BASOSABS 0.0 11/23/2023 1135    .    Latest Ref Rng & Units 11/23/2023   11:35 AM 10/28/2023    2:11 PM 08/24/2023   11:43 AM  CMP  Glucose 70 - 99 mg/dL 97  96  628   BUN 8 - 23 mg/dL 22  15  17    Creatinine 0.44 - 1.00 mg/dL 3.15  1.76  1.60   Sodium 135 - 145 mmol/L 138  140  141   Potassium 3.5 - 5.1 mmol/L 4.3  4.5  4.1   Chloride 98 - 111 mmol/L 103  100  105   CO2 22 - 32 mmol/L 31  30  31    Calcium  8.9 - 10.3 mg/dL 9.3  9.6  9.1   Total Protein 6.5 - 8.1 g/dL 6.9  6.8  6.5   Total Bilirubin 0.0 - 1.2 mg/dL 0.5  0.5  0.5   Alkaline Phos 38 - 126 U/L 107   97   AST 15 - 41 U/L 59  60  42   ALT 0 - 44 U/L 39  42  28    . Lab Results  Component Value Date   LDH 202 (H) 11/23/2023       10/16/2022 Fish panel:  RADIOGRAPHIC STUDIES: I have personally reviewed the radiological images as listed and agreed with the findings in the report. CT Soft Tissue Neck W Contrast Result Date: 11/18/2023 CLINICAL DATA:  Hematologic malignancy, SLL, CLL EXAM: CT NECK WITH CONTRAST TECHNIQUE: Multidetector CT imaging of the neck was performed using the standard protocol following the bolus administration of intravenous contrast. RADIATION DOSE REDUCTION: This exam was performed according to the departmental dose-optimization program which includes automated exposure control, adjustment of the mA and/or kV according to patient size and/or use of iterative reconstruction technique. CONTRAST:  OMNIPAQUE  IOHEXOL  300 MG/ML  SOLN COMPARISON:  None Available. FINDINGS: Pharynx and larynx: Normal. No mass or swelling. Asymmetric prominence of the lingual tonsil on the right. Findings may represent tonsillar hypertrophy or mass. Salivary glands: No inflammation, mass, or stone. Thyroid : Normal. Lymph nodes: Cervical lymphadenopathy throughout the neck at all nodal stations. Additional axillary, retropectoral, and superior mediastinal lymphadenopathy. Representative right level 2A lymph node measures 1.9 cm. Vascular: Negative. Limited intracranial: Negative. Visualized orbits: Negative. Mastoids and visualized paranasal sinuses: Clear. Skeleton: Mild spondylitic changes in the cervical spine. Upper chest: Negative. Other: None. IMPRESSION: 1. Cervical lymphadenopathy throughout the neck at all nodal stations. Additional axillary, retropectoral, and superior mediastinal lymphadenopathy. Findings compatible with lymphoma. 2. Asymmetric prominence of the lingual tonsil on the right. Findings may represent tonsillar hypertrophy or mass. Recommend direct inspection. Electronically Signed   By: Johnanna Mylar M.D.   On: 11/18/2023 13:11   CT CHEST ABDOMEN PELVIS W CONTRAST Result Date: 10/27/2023 CLINICAL  DATA:  Hematologic malignancy, monitor. Evaluation for progressive of SLL/CLL. * Tracking Code: BO * EXAM: CT CHEST, ABDOMEN, AND PELVIS WITH CONTRAST TECHNIQUE: Multidetector CT imaging of the chest, abdomen and pelvis was performed following the standard protocol during bolus administration of intravenous contrast. RADIATION DOSE REDUCTION: This exam was performed according to the departmental dose-optimization program which includes automated exposure control, adjustment of the mA and/or kV according to patient size and/or use of iterative reconstruction technique. CONTRAST:  OMNIPAQUE  IOHEXOL  300 MG/ML  SOLN COMPARISON:  Multiple priors including CT September 28, 2022. FINDINGS: CT CHEST FINDINGS Cardiovascular: Normal caliber thoracic aorta. Normal size heart. No significant pericardial effusion/thickening. Mediastinum/Nodes: Slight increase in size of the supraclavicular, mediastinal and bilateral axillary lymph nodes. For reference: -right supraclavicular lymph node measures 10 mm in short axis on image 3/3 previously 5 mm -right axillary lymph node measures 11 mm in short axis on image 18/3 previously 9 mm -left axillary lymph node measures 12 mm in short axis on image 20/3 previously 10 mm Calcified mediastinal and hilar lymph nodes. Lungs/Pleura: Stable scattered tiny pulmonary nodules. For reference: -3 mm Peri fissural nodule in the right upper lobe on image 69/5, unchanged -3 mm perifissural pulmonary nodule in the right lower lobe on image 87/5, unchanged No new suspicious pulmonary nodules or masses. Scattered atelectasis/scarring. Musculoskeletal: No aggressive lytic or blastic lesion of bone. CT ABDOMEN PELVIS FINDINGS Hepatobiliary: Stable 17 mm hemangioma in the medial right lobe of the liver on image 61/2. No suspicious hepatic  lesion. Gallbladder is unremarkable. No biliary ductal dilation. Pancreas: No pancreatic ductal dilation or evidence of acute inflammation. Spleen: Progressive  splenomegaly measuring 19.9 cm in maximum craniocaudal dimension and 16.4 cm in maximum axial dimension previously 18.3 and 15 cm respectively. Adrenals/Urinary Tract: Bilateral adrenal glands appear normal. No hydronephrosis. Kidneys demonstrate symmetric enhancement. Urinary bladder is unremarkable for degree of distension. Stomach/Bowel: Stomach is nondistended limiting evaluation. No pathologic dilation of small or large bowel. No evidence of acute bowel inflammation. Vascular/Lymphatic: Normal caliber abdominal aorta. Similar prominent mesenteric and retroperitoneal lymph nodes. previously indexed lymph nodes are as follows: -aortocaval lymph node measures 8 mm in short axis on image 70, unchanged. -left external iliac lymph node measures 9 mm mm in short axis on image 107/3 previously 8 mm. -External iliac lymph node measures 7 mm in short axis on image 105/3, unchanged. Reproductive: Uterus and bilateral adnexa are unremarkable. Other: No significant abdominopelvic free fluid. Musculoskeletal: No aggressive lytic or blastic lesion of bone. Vertebral body hemangioma in the L5 vertebral body. Multilevel degenerative changes spine. IMPRESSION: 1. Slight increase in size of the supraclavicular, mediastinal and bilateral axillary lymph nodes. 2. Progressive splenomegaly. 3. Similar prominent mesenteric and retroperitoneal lymph nodes. 4. Stable scattered tiny pulmonary nodules. Electronically Signed   By: Tama Fails M.D.   On: 10/27/2023 16:13    ASSESSMENT & PLAN:   68 year old female with a history of previous pulmonary sarcoidosis, dyslipidemia, osteoporosis with  #1  Recently diagnosed CLL/SLL based on biopsy of incidentally noted persistence left axillary lymphadenopathy on routine mammogram. Labs today showed lymphocytosis of 5.6k Clinically patient has a few small cervical lymph nodes and bilateral axillary lymph nodes.  #2 mild thrombocytopenia 78k  PLAN:. -Discussed lab results from  today, 11/23/23, in detail with patient CBC shows normal total WBC count lymphocyte count of 4.8K platelets 87k -CT neck chest/abdomen/pelvis from 10/27/2023 shows progressive lymphadenopathy and progressive splenomegaly. - Discussed indication for treatment.  Will arrange significant lymphadenopathy.  Significant splenomegaly and thrombocytopenia. - Discussed options for treatment including BTK inhibitor versus Gazyva plus venetoclax versus BR. - Patient appears to prefer going with Gazyva plus venetoclax but would like to delay starting treatment until.  Has several into her new place since she has recently separated from her husband and is working on the divorce process  FOLLOW-UP: Return to clinic with Dr. Salomon Cree with labs in 3 months  The total time spent in the appointment was 30 minutes* .  All of the patient's questions were answered with apparent satisfaction. The patient knows to call the clinic with any problems, questions or concerns.   Jacquelyn Matt MD MS AAHIVMS [facility] Hematology/Oncology Physician [facility]  .*Total Encounter Time as defined by the Centers for Medicare and Medicaid Services includes, in addition to the face-to-face time of a patient visit (documented in the note above) non-face-to-face time: obtaining and reviewing outside history, ordering and reviewing medications, tests or procedures, care coordination (communications with other health care professionals or caregivers) and documentation in the medical record.    I,Mitra Faeizi,acting as a Neurosurgeon for Jacquelyn Matt, MD.,have documented all relevant documentation on the behalf of Jacquelyn Matt, MD,as directed by  Jacquelyn Matt, MD while in the presence of Jacquelyn Matt, MD.  .I have reviewed the above documentation for accuracy and completeness, and I agree with the above. .Su Duma Kishore Leiby Pigeon MD

## 2023-11-24 ENCOUNTER — Encounter: Payer: Self-pay | Admitting: Nurse Practitioner

## 2023-11-24 ENCOUNTER — Ambulatory Visit (INDEPENDENT_AMBULATORY_CARE_PROVIDER_SITE_OTHER): Admitting: Nurse Practitioner

## 2023-11-24 VITALS — BP 98/58 | HR 80 | Temp 97.7°F | Resp 21 | Ht 59.0 in | Wt 97.0 lb

## 2023-11-24 DIAGNOSIS — H00031 Abscess of right upper eyelid: Secondary | ICD-10-CM | POA: Diagnosis not present

## 2023-11-24 DIAGNOSIS — H00024 Hordeolum internum left upper eyelid: Secondary | ICD-10-CM | POA: Diagnosis not present

## 2023-11-24 DIAGNOSIS — C9110 Chronic lymphocytic leukemia of B-cell type not having achieved remission: Secondary | ICD-10-CM

## 2023-11-24 DIAGNOSIS — R143 Flatulence: Secondary | ICD-10-CM | POA: Diagnosis not present

## 2023-11-24 DIAGNOSIS — H00021 Hordeolum internum right upper eyelid: Secondary | ICD-10-CM | POA: Diagnosis not present

## 2023-11-24 NOTE — Progress Notes (Signed)
 Careteam: Patient Care Team: Verma Gobble, NP as PCP - General (Geriatric Medicine) Frankie Israel, MD as Consulting Physician (Hematology)  PLACE OF SERVICE:  [facility] CL[facility]  Advanced Directive information    Allergies  Allergen Reactions   Crestor [Rosuvastatin]     Leg cramps    Gabapentin Nausea And Vomiting    Chief Complaint  Patient presents with   Bloated    Gas and bloating with stomach pain. Foods don't seem to change condition.    HPI:  Discussed the use of AI scribe software for clinical note transcription with the patient, who gave verbal consent to proceed.  History of Present Illness Rebecca Aguirre is a 68 year old female with chronic lymphocytic leukemia who presents with abdominal pain and gastrointestinal symptoms.  She has been experiencing intermittent abdominal pain and gastrointestinal symptoms for at least two years, often associated with acid indigestion. She has frequent bowel movements, up to six times before noon, which are formed and not explosive diarrhea. These symptoms typically occur within half an hour after eating. She also experiences significant bloating, gas, and a sensation of fullness that prevents her from eating full meals.  She has a history of chronic lymphocytic leukemia and an enlarged spleen, which may be contributing to her abdominal symptoms by displacing other organs. She is concerned about the possibility of an H. pylori infection, given her history of cryptosporidiosis 25 years ago and her parents' history of H. pylori. She is not on any routine medication for indigestion.  She has undergone two colonoscopies, the most recent in November, which revealed a few polyps. She has not had an endoscopy and has not had recent diagnostic tests for H. pylori.  She also has chalazions on both eyes, which have been painful and persistent despite treatment with doxycycline. She has a follow-up appointment with an eye doctor  scheduled.    Review of Systems:  Review of Systems  Constitutional:  Negative for chills, fever and weight loss.  Gastrointestinal:  Positive for abdominal pain and heartburn. Negative for constipation and diarrhea.    Past Medical History:  Diagnosis Date   Anemia    Arthritis    Cataract surgeries 1/28, 08/17/2023   CLL (chronic lymphocytic leukemia) (HCC)    Hirsutism    Hypercholesteremia    Osteoporosis 10/20/2016   T score -2.8   Thyroid  disease    Past Surgical History:  Procedure Laterality Date   BASAL CELL CARCINOMA EXCISION  12/2015   CATARACT EXTRACTION, BILATERAL     08-03-23 & 08-17-23   CESAREAN SECTION  1991   TONSILLECTOMY AND ADENOIDECTOMY N/A 1960   Social History:   reports that she has never smoked. She has never used smokeless tobacco. She reports that she does not currently use alcohol. She reports that she does not use drugs.  Family History  Problem Relation Age of Onset   Osteoporosis Mother    Memory loss Mother    Dementia Mother    Heart failure Father    Irritable bowel syndrome Brother    Colon cancer Brother    Cancer Paternal Aunt        anal   Lung cancer Paternal Aunt     Medications: Patient's Medications  New Prescriptions   No medications on file  Previous Medications   ATORVASTATIN  (LIPITOR) 10 MG TABLET    By mouth twice weekly   BACILLUS COAGULANS-INULIN (PROBIOTIC-PREBIOTIC) 1-250 BILLION-MG CAPS    Take by mouth.  CALCIUM  PO    Take 400 mg by mouth 2 (two) times daily. Includes Vit D 1000, K1 100 mcg, and K 2 30 mg)   CO-ENZYME Q-10 30 MG CAPSULE    Take 30 mg by mouth 3 (three) times daily.   FERROUS SULFATE (IRON PO)    Take 22 mg by mouth as directed. RAW IRON OTC  5 times weekly   MULTIPLE VITAMIN (MULTIVITAMIN) CAPSULE    Take 1 capsule by mouth daily.   OMEGA-3 FATTY ACIDS (FISH OIL PO)    Take 1,000 mg by mouth daily. Pro-Omega   THYROID  (ARMOUR THYROID ) 15 MG TABLET    Take 1 tablet (15 mg total) by mouth  daily.   THYROID  (ARMOUR) 30 MG TABLET    Take 1 tablet (30 mg total) by mouth daily before breakfast. Take with 15 mg tablet  Modified Medications   No medications on file  Discontinued Medications   No medications on file    Physical Exam:  Vitals:   11/24/23 1035  BP: (!) 98/58  Pulse: 80  Resp: (!) 21  Temp: 97.7 F (36.5 C)  SpO2: 98%  Weight: 97 lb (44 kg)  Height: 4\' 11"  (1.499 m)   Body mass index is 19.59 kg/m. Wt Readings from Last 3 Encounters:  11/24/23 97 lb (44 kg)  11/23/23 97 lb 12.8 oz (44.4 kg)  10/28/23 95 lb (43.1 kg)    Physical Exam Constitutional:      General: She is not in acute distress.    Appearance: She is well-developed. She is not diaphoretic.  HENT:     Head: Normocephalic and atraumatic.     Mouth/Throat:     Pharynx: No oropharyngeal exudate.  Eyes:     Conjunctiva/sclera: Conjunctivae normal.     Pupils: Pupils are equal, round, and reactive to light.  Cardiovascular:     Rate and Rhythm: Normal rate and regular rhythm.     Heart sounds: Normal heart sounds.  Pulmonary:     Effort: Pulmonary effort is normal.     Breath sounds: Normal breath sounds.  Abdominal:     General: Bowel sounds are normal.     Palpations: Abdomen is soft.     Tenderness: There is abdominal tenderness (epigastric).  Musculoskeletal:     Cervical back: Normal range of motion and neck supple.     Right lower leg: No edema.     Left lower leg: No edema.  Skin:    General: Skin is warm and dry.  Neurological:     Mental Status: She is alert.  Psychiatric:        Mood and Affect: Mood normal.     Labs reviewed: Basic Metabolic Panel: Recent Labs    07/05/23 0000 07/05/23 1151 08/24/23 1143 10/04/23 1048 10/28/23 1411 11/23/23 1135  NA  --   --  141  --  140 138  K  --   --  4.1  --  4.5 4.3  CL  --   --  105  --  100 103  CO2  --   --  31  --  30 31  GLUCOSE  --   --  133*  --  96 97  BUN  --   --  17  --  15 22  CREATININE  --   --   0.79  --  0.69 0.73  CALCIUM   --   --  9.1  --  9.6 9.3  TSH 3.07  4.97  --  1.66  --   --    Liver Function Tests: Recent Labs    05/28/23 1134 08/24/23 1143 10/28/23 1411 11/23/23 1135  AST 45* 42* 60* 59*  ALT 27 28 42* 39  ALKPHOS 93 97  --  107  BILITOT 0.5 0.5 0.5 0.5  PROT 6.8 6.5 6.8 6.9  ALBUMIN 3.8 3.8  --  3.9   No results for input(s): "LIPASE", "AMYLASE" in the last 8760 hours. No results for input(s): "AMMONIA" in the last 8760 hours. CBC: Recent Labs    08/24/23 1143 10/04/23 1048 11/23/23 1135  WBC 6.5 7.0 6.6  NEUTROABS 1.0* 1,365* 1.3*  HGB 12.6 12.4 12.8  HCT 38.5 37.6 37.5  MCV 93.0 90.6 89.5  PLT 78* 91* 87*   Lipid Panel: Recent Labs    10/04/23 1048  CHOL 200*  HDL 52  LDLCALC 126*  TRIG 111  CHOLHDL 3.8   TSH: Recent Labs    07/05/23 0000 07/05/23 1151 10/04/23 1048  TSH 3.07 4.97 1.66   A1C: Lab Results  Component Value Date   HGBA1C 5.9 (H) 10/04/2023     Assessment/Plan Assessment and Plan Assessment & Plan Chronic lymphocytic leukemia (CLL) Chronic lymphocytic leukemia with splenomegaly causing abdominal distension. She is aware of the need to initiate treatment and is deciding on treatment options.  Abdominal pain/gas Intermittent abdominal pain with bloating, gas, and frequent bowel movements postprandially. Differential includes Helicobacter pylori infection. - Order H. pylori breath test today Has been followed by GI  Chalazion Chalazion in both eyes with recent increase in size and pain. Discomfort reported from doxycycline. - Follow up with ophthalmology today for chalazion evaluation.  Tashiya Souders K. Denney Fisherman [facility] [phone]

## 2023-11-25 ENCOUNTER — Ambulatory Visit: Payer: Self-pay | Admitting: Family

## 2023-11-25 LAB — H. PYLORI BREATH TEST: H. pylori Breath Test: NOT DETECTED

## 2023-12-29 DIAGNOSIS — H01001 Unspecified blepharitis right upper eyelid: Secondary | ICD-10-CM | POA: Diagnosis not present

## 2023-12-29 DIAGNOSIS — B880 Other acariasis: Secondary | ICD-10-CM | POA: Diagnosis not present

## 2023-12-29 DIAGNOSIS — H01004 Unspecified blepharitis left upper eyelid: Secondary | ICD-10-CM | POA: Diagnosis not present

## 2023-12-29 DIAGNOSIS — H00024 Hordeolum internum left upper eyelid: Secondary | ICD-10-CM | POA: Diagnosis not present

## 2024-01-12 ENCOUNTER — Encounter: Payer: Self-pay | Admitting: Nurse Practitioner

## 2024-01-12 DIAGNOSIS — M810 Age-related osteoporosis without current pathological fracture: Secondary | ICD-10-CM

## 2024-01-14 MED ORDER — DENOSUMAB 60 MG/ML ~~LOC~~ SOSY
60.0000 mg | PREFILLED_SYRINGE | SUBCUTANEOUS | Status: AC
  Administered 2024-03-02: 60 mg via SUBCUTANEOUS

## 2024-01-14 NOTE — Telephone Encounter (Signed)
 Can we look into doing a prior authorization on the prolia  for her as she is wanting to get this through our office.

## 2024-01-14 NOTE — Telephone Encounter (Signed)
 Order placed for Prolia  to go in [place].  Will Submit Verification.

## 2024-01-17 ENCOUNTER — Other Ambulatory Visit

## 2024-01-17 DIAGNOSIS — E785 Hyperlipidemia, unspecified: Secondary | ICD-10-CM

## 2024-01-18 ENCOUNTER — Other Ambulatory Visit

## 2024-01-18 DIAGNOSIS — E785 Hyperlipidemia, unspecified: Secondary | ICD-10-CM | POA: Diagnosis not present

## 2024-01-18 LAB — COMPREHENSIVE METABOLIC PANEL WITH GFR
AG Ratio: 1.4 (calc) (ref 1.0–2.5)
ALT: 37 U/L — ABNORMAL HIGH (ref 6–29)
AST: 53 U/L — ABNORMAL HIGH (ref 10–35)
Albumin: 3.7 g/dL (ref 3.6–5.1)
Alkaline phosphatase (APISO): 92 U/L (ref 37–153)
BUN: 16 mg/dL (ref 7–25)
CO2: 30 mmol/L (ref 20–32)
Calcium: 9 mg/dL (ref 8.6–10.4)
Chloride: 104 mmol/L (ref 98–110)
Creat: 0.75 mg/dL (ref 0.50–1.05)
Globulin: 2.6 g/dL (ref 1.9–3.7)
Glucose, Bld: 105 mg/dL — ABNORMAL HIGH (ref 65–99)
Potassium: 4.2 mmol/L (ref 3.5–5.3)
Sodium: 140 mmol/L (ref 135–146)
Total Bilirubin: 0.6 mg/dL (ref 0.2–1.2)
Total Protein: 6.3 g/dL (ref 6.1–8.1)
eGFR: 86 mL/min/1.73m2 (ref >=60)

## 2024-01-18 LAB — LIPID PANEL
Cholesterol: 183 mg/dL (ref <200)
HDL: 50 mg/dL (ref >=50)
LDL Cholesterol (Calc): 110 mg/dL — ABNORMAL HIGH
Non-HDL Cholesterol (Calc): 133 mg/dL — ABNORMAL HIGH (ref <130)
Total CHOL/HDL Ratio: 3.7 (calc) (ref <5.0)
Triglycerides: 124 mg/dL (ref <150)

## 2024-01-20 ENCOUNTER — Telehealth: Payer: Self-pay | Admitting: *Deleted

## 2024-01-20 NOTE — Telephone Encounter (Signed)
 Verification Submitted to [employer] for NEW START of Prolia .    [facility] (supporting Harlene MARLA An, NP)     01/13/24  3:35 PM I have not received treatment with Prolia  in the past.  I have discussed it with Dr. Glennon, gynecologist, who wanted me tor try Evenity . Dr. Onesimo, my oncologist, said that there isn't enough documentation regarding its use with CLL patients.    That's why I am choosing Prolia  at this time.   I am willing to wait until it can be arranged.   My next Bone Density scan can not be done until December.  I have an appointment on Dec. 15 at [facility] on [address].   Thanks for your reply.  I'll see you on July 21 for the follow-up after labs.

## 2024-01-24 ENCOUNTER — Ambulatory Visit (INDEPENDENT_AMBULATORY_CARE_PROVIDER_SITE_OTHER): Admitting: Nurse Practitioner

## 2024-01-24 ENCOUNTER — Encounter: Payer: Self-pay | Admitting: Nurse Practitioner

## 2024-01-24 VITALS — BP 118/76 | HR 76 | Temp 97.1°F | Resp 12 | Ht 59.0 in | Wt 96.2 lb

## 2024-01-24 DIAGNOSIS — R252 Cramp and spasm: Secondary | ICD-10-CM

## 2024-01-24 DIAGNOSIS — C9110 Chronic lymphocytic leukemia of B-cell type not having achieved remission: Secondary | ICD-10-CM

## 2024-01-24 DIAGNOSIS — M810 Age-related osteoporosis without current pathological fracture: Secondary | ICD-10-CM

## 2024-01-24 DIAGNOSIS — E785 Hyperlipidemia, unspecified: Secondary | ICD-10-CM

## 2024-01-24 NOTE — Patient Instructions (Signed)
 Stop lipitor.   Will follow up liver enzymes when you see your oncologist.  Also will see if this helps with muscle cramps

## 2024-01-24 NOTE — Progress Notes (Signed)
 Careteam: Patient Care Team: Caro Harlene POUR, NP as PCP - General (Geriatric Medicine) [name], MD as Consulting Physician (Hematology)  PLACE OF SERVICE:  [facility] CLIN[facility]  Advanced Directive information    Allergies  Allergen Reactions  . Crestor [Rosuvastatin]     Leg cramps   . Gabapentin Nausea And Vomiting    Chief Complaint  Patient presents with  . Medical Management of Chronic Issues    3 month follow up / wants to discuss blood work / followup    HPI:  Discussed the use of AI scribe software for clinical note transcription with the patient, who gave verbal consent to proceed.  History of Present Illness Rebecca Aguirre is a 68 year old female with chronic lymphocytic leukemia who presents for a three-month follow-up and discussion of Prolia  treatment.  She is considering starting a BTK inhibitor for her chronic lymphocytic leukemia and is scheduled to see her hematologist on August 20th. She has experienced elevated liver enzymes since November. A CT scan in April showed no suspicious hepatic lesions. She has significant bruising and muscle cramps that are severe enough to wake her at night. The cramps occur in her hands, legs, and toes, sometimes while sitting or upon waking, and have been persistent for a long time.  She is currently taking Lipitor twice a week for hyperlipidemia, which she started after a period without statins due to muscle cramps. Stopping the statin previously reduced the frequency of cramps, although they did not completely resolve. Her cholesterol has been high, with a family history of hyperlipidemia, as her father also had high cholesterol.  She is dealing with eye issues following cataract surgery, including dry eyes and blepharitis, and has been diagnosed with eyelash mites. She is using Xdemvy drops for treatment and is scheduled for a follow-up on August 4th. She hopes to get a prescription for bifocal glasses to avoid using  readers.  She has recently moved to [place] and is in the process of settling in. She is dealing with personal issues, including separation from her husband and managing legal documents with her attorney. She describes her new living situation as peaceful, despite the ongoing personal challenges.  ***  Review of Systems:  ROS***  Past Medical History:  Diagnosis Date  . Anemia   . Arthritis   . Cataract surgeries 1/28, 08/17/2023  . CLL (chronic lymphocytic leukemia) (HCC)   . Hirsutism   . Hypercholesteremia   . Osteoporosis 10/20/2016   T score -2.8  . Thyroid  disease    Past Surgical History:  Procedure Laterality Date  . BASAL CELL CARCINOMA EXCISION  12/2015  . CATARACT EXTRACTION, BILATERAL     08-03-23 & 08-17-23  . CESAREAN SECTION  1991  . TONSILLECTOMY AND ADENOIDECTOMY N/A 1960   Social History:   reports that she has never smoked. She has never used smokeless tobacco. She reports that she does not currently use alcohol. She reports that she does not use drugs.  Family History  Problem Relation Age of Onset  . Osteoporosis Mother   . Memory loss Mother   . Dementia Mother   . Heart failure Father   . Irritable bowel syndrome Brother   . Colon cancer Brother   . Cancer Paternal Aunt        anal  . Lung cancer Paternal Aunt     Medications: Patient's Medications  New Prescriptions   No medications on file  Previous Medications   ATORVASTATIN  (LIPITOR) 10  MG TABLET    By mouth twice weekly   BACILLUS COAGULANS-INULIN (PROBIOTIC-PREBIOTIC) 1-250 BILLION-MG CAPS    Take by mouth.   CALCIUM  PO    Take 400 mg by mouth 2 (two) times daily. Includes Vit D 1000, K1 100 mcg, and K 2 30 mg)   CO-ENZYME Q-10 30 MG CAPSULE    Take 30 mg by mouth 3 (three) times daily.   FERROUS SULFATE (IRON PO)    Take 22 mg by mouth as directed. RAW IRON OTC  5 times weekly   MULTIPLE VITAMIN (MULTIVITAMIN) CAPSULE    Take 1 capsule by mouth daily.   OMEGA-3 FATTY ACIDS (FISH  OIL PO)    Take 1,000 mg by mouth daily. Pro-Omega   THYROID  (ARMOUR THYROID ) 15 MG TABLET    Take 1 tablet (15 mg total) by mouth daily.   THYROID  (ARMOUR) 30 MG TABLET    Take 1 tablet (30 mg total) by mouth daily before breakfast. Take with 15 mg tablet  Modified Medications   No medications on file  Discontinued Medications   No medications on file    Physical Exam:  Vitals:   01/24/24 1118  BP: 118/76  Pulse: 76  Resp: 12  Temp: (!) 97.1 F (36.2 C)  SpO2: 96%  Weight: 96 lb 3.2 oz (43.6 kg)  Height: 4' 11 (1.499 m)   Body mass index is 19.43 kg/m. Wt Readings from Last 3 Encounters:  11/24/23 97 lb (44 kg)  11/23/23 97 lb 12.8 oz (44.4 kg)  10/28/23 95 lb (43.1 kg)    Physical Exam***  Labs reviewed: Basic Metabolic Panel: Recent Labs    07/05/23 0000 07/05/23 1151 08/24/23 1143 10/04/23 1048 10/28/23 1411 11/23/23 1135 01/18/24 1105  NA  --   --    < >  --  140 138 140  K  --   --    < >  --  4.5 4.3 4.2  CL  --   --    < >  --  100 103 104  CO2  --   --    < >  --  30 31 30   GLUCOSE  --   --    < >  --  96 97 105*  BUN  --   --    < >  --  15 22 16   CREATININE  --   --    < >  --  0.69 0.73 0.75  CALCIUM   --   --    < >  --  9.6 9.3 9.0  TSH 3.07 4.97  --  1.66  --   --   --    < > = values in this interval not displayed.   Liver Function Tests: Recent Labs    05/28/23 1134 08/24/23 1143 10/28/23 1411 11/23/23 1135 01/18/24 1105  AST 45* 42* 60* 59* 53*  ALT 27 28 42* 39 37*  ALKPHOS 93 97  --  107  --   BILITOT 0.5 0.5 0.5 0.5 0.6  PROT 6.8 6.5 6.8 6.9 6.3  ALBUMIN 3.8 3.8  --  3.9  --    No results for input(s): LIPASE, AMYLASE in the last 8760 hours. No results for input(s): AMMONIA in the last 8760 hours. CBC: Recent Labs    08/24/23 1143 10/04/23 1048 11/23/23 1135  WBC 6.5 7.0 6.6  NEUTROABS 1.0* 1,365* 1.3*  HGB 12.6 12.4 12.8  HCT 38.5 37.6 37.5  MCV 93.0 90.6  89.5  PLT 78* 91* 87*   Lipid Panel: Recent Labs     10/04/23 1048 01/18/24 1105  CHOL 200* 183  HDL 52 50  LDLCALC 126* 110*  TRIG 111 124  CHOLHDL 3.8 3.7   TSH: Recent Labs    07/05/23 0000 07/05/23 1151 10/04/23 1048  TSH 3.07 4.97 1.66   A1C: Lab Results  Component Value Date   HGBA1C 5.9 (H) 10/04/2023     Assessment/Plan *** There are no diagnoses linked to this encounter.   No follow-ups on file.: ***  Camala Talwar K. Caro BODILY [facility] [phone]

## 2024-01-26 ENCOUNTER — Encounter: Payer: Self-pay | Admitting: Hematology

## 2024-01-27 NOTE — Telephone Encounter (Signed)
 Received Verification from [employer] for Prolia  and No Copay, No PA Called and scheduled an appointment with patient.

## 2024-02-07 DIAGNOSIS — H01004 Unspecified blepharitis left upper eyelid: Secondary | ICD-10-CM | POA: Diagnosis not present

## 2024-02-07 DIAGNOSIS — H35372 Puckering of macula, left eye: Secondary | ICD-10-CM | POA: Diagnosis not present

## 2024-02-07 DIAGNOSIS — H01001 Unspecified blepharitis right upper eyelid: Secondary | ICD-10-CM | POA: Diagnosis not present

## 2024-02-07 DIAGNOSIS — B880 Other acariasis: Secondary | ICD-10-CM | POA: Diagnosis not present

## 2024-02-08 DIAGNOSIS — Z1231 Encounter for screening mammogram for malignant neoplasm of breast: Secondary | ICD-10-CM | POA: Diagnosis not present

## 2024-02-08 LAB — HM MAMMOGRAPHY

## 2024-02-10 ENCOUNTER — Encounter: Payer: Self-pay | Admitting: Obstetrics and Gynecology

## 2024-02-10 ENCOUNTER — Ambulatory Visit: Payer: Self-pay | Admitting: Obstetrics and Gynecology

## 2024-02-11 ENCOUNTER — Telehealth: Payer: Self-pay

## 2024-02-11 ENCOUNTER — Other Ambulatory Visit (HOSPITAL_COMMUNITY): Payer: Self-pay

## 2024-02-11 ENCOUNTER — Other Ambulatory Visit: Payer: Self-pay | Admitting: Hematology

## 2024-02-11 DIAGNOSIS — C9110 Chronic lymphocytic leukemia of B-cell type not having achieved remission: Secondary | ICD-10-CM

## 2024-02-11 MED ORDER — ACALABRUTINIB 100 MG PO CAPS: 100.0000 mg | ORAL_CAPSULE | Freq: Two times a day (BID) | ORAL | 2 refills | Status: DC

## 2024-02-11 MED ORDER — ONDANSETRON HCL 8 MG PO TABS
8.0000 mg | ORAL_TABLET | Freq: Three times a day (TID) | ORAL | 0 refills | Status: AC | PRN
  Filled 2024-02-11: qty 20, 7d supply, fill #0

## 2024-02-11 MED ORDER — ALLOPURINOL 100 MG PO TABS
100.0000 mg | ORAL_TABLET | Freq: Every day | ORAL | 1 refills | Status: AC
Start: 2024-02-11 — End: ?
  Filled 2024-02-11: qty 60, 60d supply, fill #0
  Filled 2024-04-17 (×2): qty 60, 60d supply, fill #1

## 2024-02-11 NOTE — Telephone Encounter (Signed)
 Oral Oncology Patient Advocate Encounter   Received notification that prior authorization for Calquence  100mg  is required.   PA submitted on 02/11/24 Key [id number] Status is pending     [name], CPhT [place]  [facility] Pharm[facility] Services Pharmacy Technician Patient Advocate Specialist II [name] Phone: [phone]  Fax: [phone] [email or web address]

## 2024-02-11 NOTE — Telephone Encounter (Signed)
 Oral Oncology Patient Advocate Encounter  Prior Authorization for Calquence  has been approved.    PA# [id number] Effective dates: 0725/25 until further notice.  Patients co-pay is $1,076.89.  Pending grant investigation.  [name], CPhT [place]  [facility] Specialty Pharmacy Services Pharmacy Technician Patient Advocate Specialist II [name] Phone: [phone]  Fax: [phone] [email or web address]

## 2024-02-11 NOTE — Telephone Encounter (Addendum)
 Oral Oncology Patient Advocate Encounter  Was successful in securing patient a $8000 grant from [facility] to provide copayment coverage for Calquence .  This will keep the out of pocket expense at $0.     [employer] ID: [id number]   The billing information is as follows and has been shared with [facility] and added in WAM.    RxBin: [id number] PCN: [id number] Member ID: [id number] Group ID: [id number] Dates of Eligibility: 01/12/24 through 01/10/25  Fund:  CLL  [name], CPhT [place]  [facility] Services Pharmacy Technician Patient Advocate Specialist II [name] Phone: [phone]  Fax: [phone] [email or web address]

## 2024-02-11 NOTE — Telephone Encounter (Signed)
 Oral Oncology Pharmacist Encounter  Received new prescription for Calquence  (acalabrutinib ) for the treatment of CLL, planned duration until disease progression or unacceptable toxicity.  Patient will have new labs at upcoming appointment on 02/23/2024. Prescription dose and frequency assessed for appropriateness.   Current medication list in Epic reviewed, no significant/ relevant DDIs with Calquence  identified: Prescription will be modified for the calquence  tablets. Patient will not have to space out the time from administration of calcium / antacids with the calquence  tablets versus the capsules.  Evaluated chart and no patient barriers to medication adherence noted.   Prescription has been e-scribed to the [facility] for benefits analysis and approval.  Oral Oncology Clinic will continue to follow for insurance authorization, copayment issues, initial counseling and start date.  Emric Kowalewski, PharmD Hematology/Oncology Clinical Pharmacist [facility] Oral Chemotherapy Navigation Clinic [phone] 02/11/2024 12:42 PM

## 2024-02-14 ENCOUNTER — Other Ambulatory Visit (HOSPITAL_COMMUNITY): Payer: Self-pay

## 2024-02-14 ENCOUNTER — Encounter: Payer: Self-pay | Admitting: Hematology

## 2024-02-14 ENCOUNTER — Telehealth: Payer: Self-pay

## 2024-02-14 NOTE — Telephone Encounter (Signed)
 Contacted pt regarding MyChart message. Explained to pt the additional medication she has been prescribed. Pt acknowledged information and verbalized understanding.

## 2024-02-21 ENCOUNTER — Other Ambulatory Visit: Payer: Self-pay

## 2024-02-21 ENCOUNTER — Other Ambulatory Visit (HOSPITAL_COMMUNITY): Payer: Self-pay

## 2024-02-21 MED ORDER — ACALABRUTINIB MALEATE 100 MG PO TABS
100.0000 mg | ORAL_TABLET | Freq: Two times a day (BID) | ORAL | 2 refills | Status: DC
  Filled 2024-02-22: qty 60, 30d supply, fill #0
  Filled 2024-04-17 (×2): qty 60, 30d supply, fill #1
  Filled 2024-05-12 – 2024-06-08 (×3): qty 60, 30d supply, fill #2

## 2024-02-21 NOTE — Telephone Encounter (Signed)
 Attempted to call patient with our PGY2 oncology resident, voicemail was left for patient to go over medication. We will try again tomorrow, 8/19.   Charmion Hapke, PharmD Hematology/Oncology Clinical Pharmacist [facility] [phone]

## 2024-02-21 NOTE — Progress Notes (Unsigned)
 Specialty Pharmacy Initial Fill Coordination Note  Rebecca Aguirre is a 68 y.o. female contacted today regarding refills of specialty medication(s) Acalabrutinib  (CALQUENCE ) .  Patient requested [name] at [facility] at [place]  on 02/23/24   Medication will be filled on 02/22/24.   Patient is aware of $0.00 copayment.    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  [name], CPhT [place]  [facility] Specialty Pharmacy Services Pharmacy Technician Patient Advocate Specialist II [name] Phone: [phone]  Fax: [phone] [email or web address]

## 2024-02-22 ENCOUNTER — Other Ambulatory Visit: Payer: Self-pay

## 2024-02-22 ENCOUNTER — Other Ambulatory Visit (HOSPITAL_COMMUNITY): Payer: Self-pay

## 2024-02-22 DIAGNOSIS — C9110 Chronic lymphocytic leukemia of B-cell type not having achieved remission: Secondary | ICD-10-CM

## 2024-02-23 ENCOUNTER — Inpatient Hospital Stay (HOSPITAL_BASED_OUTPATIENT_CLINIC_OR_DEPARTMENT_OTHER): Admitting: Hematology

## 2024-02-23 ENCOUNTER — Other Ambulatory Visit (HOSPITAL_COMMUNITY): Payer: Self-pay

## 2024-02-23 ENCOUNTER — Inpatient Hospital Stay: Attending: Hematology

## 2024-02-23 VITALS — BP 105/70 | HR 66 | Temp 97.7°F | Resp 18 | Wt 98.2 lb

## 2024-02-23 DIAGNOSIS — D709 Neutropenia, unspecified: Secondary | ICD-10-CM | POA: Diagnosis not present

## 2024-02-23 DIAGNOSIS — R161 Splenomegaly, not elsewhere classified: Secondary | ICD-10-CM

## 2024-02-23 DIAGNOSIS — Z801 Family history of malignant neoplasm of trachea, bronchus and lung: Secondary | ICD-10-CM | POA: Insufficient documentation

## 2024-02-23 DIAGNOSIS — Z800 Family history of malignant neoplasm of digestive organs: Secondary | ICD-10-CM | POA: Diagnosis not present

## 2024-02-23 DIAGNOSIS — C9110 Chronic lymphocytic leukemia of B-cell type not having achieved remission: Secondary | ICD-10-CM | POA: Insufficient documentation

## 2024-02-23 DIAGNOSIS — D696 Thrombocytopenia, unspecified: Secondary | ICD-10-CM | POA: Diagnosis not present

## 2024-02-23 DIAGNOSIS — M810 Age-related osteoporosis without current pathological fracture: Secondary | ICD-10-CM | POA: Insufficient documentation

## 2024-02-23 DIAGNOSIS — R590 Localized enlarged lymph nodes: Secondary | ICD-10-CM | POA: Insufficient documentation

## 2024-02-23 DIAGNOSIS — Z807 Family history of other malignant neoplasms of lymphoid, hematopoietic and related tissues: Secondary | ICD-10-CM | POA: Insufficient documentation

## 2024-02-23 LAB — CMP (CANCER CENTER ONLY)
ALT: 39 U/L (ref 0–44)
AST: 55 U/L — ABNORMAL HIGH (ref 15–41)
Albumin: 3.7 g/dL (ref 3.5–5.0)
Alkaline Phosphatase: 111 U/L (ref 38–126)
Anion gap: 3 — ABNORMAL LOW (ref 5–15)
BUN: 14 mg/dL (ref 8–23)
CO2: 32 mmol/L (ref 22–32)
Calcium: 9.1 mg/dL (ref 8.9–10.3)
Chloride: 104 mmol/L (ref 98–111)
Creatinine: 0.62 mg/dL (ref 0.44–1.00)
GFR, Estimated: >60 mL/min (ref >60)
Glucose, Bld: 88 mg/dL (ref 70–99)
Potassium: 4.4 mmol/L (ref 3.5–5.1)
Sodium: 139 mmol/L (ref 135–145)
Total Bilirubin: 0.6 mg/dL (ref 0.0–1.2)
Total Protein: 6.5 g/dL (ref 6.5–8.1)

## 2024-02-23 LAB — CBC WITH DIFFERENTIAL (CANCER CENTER ONLY)
Abs Immature Granulocytes: 0.02 K/uL (ref 0.00–0.07)
Basophils Absolute: 0 K/uL (ref 0.0–0.1)
Basophils Relative: 0 %
Eosinophils Absolute: 0.1 K/uL (ref 0.0–0.5)
Eosinophils Relative: 1 %
HCT: 38.5 % (ref 36.0–46.0)
Hemoglobin: 13 g/dL (ref 12.0–15.0)
Immature Granulocytes: 0 %
Lymphocytes Relative: 75 %
Lymphs Abs: 5.6 K/uL — ABNORMAL HIGH (ref 0.7–4.0)
MCH: 30.4 pg (ref 26.0–34.0)
MCHC: 33.8 g/dL (ref 30.0–36.0)
MCV: 90.2 fL (ref 80.0–100.0)
Monocytes Absolute: 0.4 K/uL (ref 0.1–1.0)
Monocytes Relative: 5 %
Neutro Abs: 1.4 K/uL — ABNORMAL LOW (ref 1.7–7.7)
Neutrophils Relative %: 19 %
Platelet Count: 82 K/uL — ABNORMAL LOW (ref 150–400)
RBC: 4.27 MIL/uL (ref 3.87–5.11)
RDW: 15.2 % (ref 11.5–15.5)
Smear Review: NORMAL
WBC Count: 7.5 K/uL (ref 4.0–10.5)
nRBC: 0 % (ref 0.0–0.2)

## 2024-02-23 LAB — LACTATE DEHYDROGENASE: LDH: 187 U/L (ref 98–192)

## 2024-02-23 NOTE — Progress Notes (Signed)
 Patient was counseled on Calquence  in telephone encounter opened on 02/11/2024.   Libbie Bartley, PharmD Hematology/Oncology Clinical Pharmacist [facility] [phone]

## 2024-02-23 NOTE — Telephone Encounter (Signed)
 Clinical Pharmacist Encounter   Patient Education I spoke with patient for overview of new oral chemotherapy medication: Calquence  (acalabrutinib ) for the treatment of CLL, planned duration until disease progression or unacceptable toxicity.   Counseled patient on administration, dosing, side effects, monitoring, drug-food interactions, safe handling, storage, and disposal.  Treatment goal: Control Plan Start Date: Monday February 28, 2024 per patient   Patient will take Take 1 tablet (100 mg total) by mouth once daily for the next few weeks and then the provider will reassess based on labs if they will transition to twice daily dosing or remain on once daily dosing.  Side effects include but not limited to:   Decreased cell counts: Reduction in hemoglobin (RBCs) can increase fatigue. The resulting fatigue should not interfere with daily activities. Advised the patient to let their provider know if they experience fatigue that prevents them from participating in daily activities.  Reduction in platelets can increase risk of bleeding. Bruising is not unusual when on this medication. Advised the patient to let their provider know if they have a planned dental or doctor procedure as they may need to hold their medication before and after the procedure for a specific duration. Reduction in WBC can increase risk of infection. Advised the patient to call their provider if they have a fever of > 100.4 F.  Headache: Informed that patient that this is a common side effect at the beginning of therapy but tends to improve over time. They can treat using either Tylenol or caffeine - such as a cup of coffee.  Diarrhea: Informed the patient that this is also a side effect that can start at the beginning of therapy, but improve over time. They can treat using over the counter Imodium. Advised that if they still experience >= 4 loose stools above baseline that they should let their provider know.  Muscle  pain/joint: Informed the patient this is a possible side effect. They can treat the pain with Tylenol but to avoid taking NSAIDs.   Reviewed with patient importance of keeping a medication schedule and plan for any missed doses.  Communication and Learning Assessment Primary learner: Patient Barriers to learning: No barriers Preferred language: English Learning preferences: Listening Reading  After discussion with patient no patient barriers to medication adherence identified.   Patient voiced understanding and appreciation. All questions answered. Medication handout provided.  Provided patient with Oral Chemotherapy Navigation Clinic phone number. Patient knows to call the office with questions or concerns. Oral Chemotherapy Navigation Clinic will continue to follow.  Thank you for allowing pharmacy to participate in this patient's care.  Alfonso MARLA Buys, PharmD Pharmacy Resident  02/23/2024 3:12 PM

## 2024-02-24 ENCOUNTER — Encounter: Payer: Self-pay | Admitting: Family

## 2024-02-24 ENCOUNTER — Ambulatory Visit (INDEPENDENT_AMBULATORY_CARE_PROVIDER_SITE_OTHER): Admitting: Family

## 2024-02-24 ENCOUNTER — Ambulatory Visit: Payer: Self-pay

## 2024-02-24 ENCOUNTER — Ambulatory Visit

## 2024-02-24 VITALS — BP 108/72 | HR 84 | Temp 97.7°F | Resp 18 | Ht 59.0 in | Wt 98.2 lb

## 2024-02-24 DIAGNOSIS — J029 Acute pharyngitis, unspecified: Secondary | ICD-10-CM

## 2024-02-24 DIAGNOSIS — C9110 Chronic lymphocytic leukemia of B-cell type not having achieved remission: Secondary | ICD-10-CM | POA: Diagnosis not present

## 2024-02-24 DIAGNOSIS — M810 Age-related osteoporosis without current pathological fracture: Secondary | ICD-10-CM

## 2024-02-24 DIAGNOSIS — R748 Abnormal levels of other serum enzymes: Secondary | ICD-10-CM

## 2024-02-24 LAB — POCT RAPID STREP A (OFFICE): Rapid Strep A Screen: NEGATIVE

## 2024-02-24 NOTE — Progress Notes (Signed)
 Provider: Skylee Baird FNP-C  Caro Harlene POUR, NP  Patient Care Team: Caro Harlene POUR, NP as PCP - General (Geriatric Medicine) [name], MD as Consulting Physician (Hematology)  Extended Emergency Contact Information Primary Emergency Contact: Miklas,Harry Address: [address]          [place] [address] [place] Mobile Phone: [phone] Relation: Spouse  Code Status:  Full Code  Goals of care: Advanced Directive information    02/24/2024    1:54 PM  Advanced Directives  Does Patient Have a Medical Advance Directive? Yes  Type of Estate agent of [place];Living will  Does patient want to make changes to medical advance directive? No - Patient declined  Copy of Healthcare Power of Attorney in Chart? Yes - validated most recent copy scanned in chart (See row information)     Chief Complaint  Patient presents with   Sore Throat    Discussed the use of AI scribe software for clinical note transcription with the patient, who gave verbal consent to proceed.  History of Present Illness   Rebecca Aguirre is a 68 year old female with leukemia who presents with a sore throat and ear pain.  She began experiencing symptoms of a scratchy, sore throat and ear pain yesterday around midnight. Additionally, she has nasal congestion in one nostril and sneezing. No fever, chills, nausea, vomiting, or body aches. Her appetite remains good.  She is scheduled to start treatment for leukemia on Monday, which includes Calquence  and allopurinol . She has not taken any medications like Tylenol to avoid masking a potential fever.  Her current medications include calcium , a multivitamin, ferrous sulfate, omega-3 fatty acids, and amour thyroid . She is no longer taking atorvastatin  or coenzyme Q10.  She has a history of leg cramps that can last for several days. She is allergic to Crestor and gabapentin, with the latter causing  pain but not nausea or vomiting.  She has a history of blepharitis and recently stopped using Xigduo eye drops. She also has arthritis, which occasionally affects her. Recent blood work showed normal white blood cell count and hemoglobin, but low platelets. Her liver enzymes have been slightly elevated in the past.   Past Medical History:  Diagnosis Date   Anemia    Arthritis    Cataract surgeries 1/28, 08/17/2023   CLL (chronic lymphocytic leukemia) (HCC)    Hirsutism    Hypercholesteremia    Osteoporosis 10/20/2016   T score -2.8   Thyroid  disease    Past Surgical History:  Procedure Laterality Date   BASAL CELL CARCINOMA EXCISION  12/2015   CATARACT EXTRACTION, BILATERAL     08-03-23 & 08-17-23   CESAREAN SECTION  1991   TONSILLECTOMY AND ADENOIDECTOMY N/A 1960    Allergies  Allergen Reactions   Crestor [Rosuvastatin]     Leg cramps    Gabapentin Nausea And Vomiting    Outpatient Encounter Medications as of 02/24/2024  Medication Sig   acalabrutinib  maleate (CALQUENCE ) 100 MG tablet Take 1 tablet (100 mg total) by mouth 2 (two) times daily.   allopurinol  (ZYLOPRIM ) 100 MG tablet Take 1 tablet (100 mg total) by mouth daily.   Bacillus Coagulans-Inulin (PROBIOTIC-PREBIOTIC) 1-250 BILLION-MG CAPS Take by mouth.   CALCIUM  PO Take 400 mg by mouth 2 (two) times daily. Includes Vit D 1000, K1 100 mcg, and K 2 30 mg)   Ferrous Sulfate (IRON PO) Take 22 mg by mouth as directed. RAW IRON OTC  5  times weekly   Multiple Vitamin (MULTIVITAMIN) capsule Take 1 capsule by mouth daily.   Omega-3 Fatty Acids (FISH OIL PO) Take 1,000 mg by mouth daily. Pro-Omega   ondansetron  (ZOFRAN ) 8 MG tablet Take 1 tablet (8 mg total) by mouth every 8 (eight) hours as needed for nausea or vomiting.   thyroid  (ARMOUR THYROID ) 15 MG tablet Take 1 tablet (15 mg total) by mouth daily.   thyroid  (ARMOUR) 30 MG tablet Take 1 tablet (30 mg total) by mouth daily before breakfast. Take with 15 mg tablet    [DISCONTINUED] atorvastatin  (LIPITOR) 10 MG tablet By mouth twice weekly (Patient not taking: Reported on 02/24/2024)   [DISCONTINUED] co-enzyme Q-10 30 MG capsule Take 30 mg by mouth 3 (three) times daily. (Patient not taking: Reported on 02/24/2024)   Facility-Administered Encounter Medications as of 02/24/2024  Medication   denosumab  (PROLIA ) injection 60 mg   [DISCONTINUED] Romosozumab -aqqg (EVENITY ) 105 MG/1. injection 210 mg    Review of Systems  Constitutional:  Negative for appetite change, chills, fatigue, fever and unexpected weight change.  HENT:  Positive for ear pain and sore throat. Negative for congestion, dental problem, ear discharge, hearing loss, nosebleeds, postnasal drip, rhinorrhea, sinus pressure, sinus pain, sneezing, tinnitus and trouble swallowing.   Eyes:  Negative for pain, discharge, redness, itching and visual disturbance.  Respiratory:  Negative for cough, chest tightness, shortness of breath and wheezing.   Cardiovascular:  Negative for chest pain, palpitations and leg swelling.  Gastrointestinal:  Negative for abdominal distention, abdominal pain, constipation, diarrhea, nausea and vomiting.  Musculoskeletal:  Negative for arthralgias, back pain, gait problem, joint swelling, myalgias, neck pain and neck stiffness.  Skin:  Negative for color change, pallor and rash.  Neurological:  Negative for dizziness, weakness, light-headedness and headaches.  Hematological:  Does not bruise/bleed easily.    Immunization History  Administered Date(s) Administered   Fluzone Influenza virus vaccine,trivalent (IIV3), split virus 05/10/2013   Hepatitis A, Adult 01/31/2007, 08/11/2007   Influenza-Unspecified 04/16/2020, 05/12/2021, 05/11/2022   PFIZER(Purple Top)SARS-COV-2 Vaccination 09/03/2019, 10/01/2019, 06/13/2020   PNEUMOCOCCAL CONJUGATE-20 05/25/2022   PPD Test 06/19/2016   Pneumococcal Polysaccharide-23 01/22/2020   Tdap 01/31/2007, 09/02/2009, 06/19/2021    Zoster Recombinant(Shingrix) 12/18/2020, 03/20/2021   Zoster, Live 12/18/2020, 03/20/2021   Pertinent  Health Maintenance Due  Topic Date Due   INFLUENZA VACCINE  10/03/2024 (Originally 02/04/2024)   MAMMOGRAM  02/07/2026   Colonoscopy  05/05/2033   DEXA SCAN  Completed      09/30/2023   10:12 AM 10/15/2023    1:37 PM 02/24/2024    1:54 PM  Fall Risk  Falls in the past year? 0 0 0  Was there an injury with Fall? 0 0 0  Fall Risk Category Calculator 0 0 0  Patient at Risk for Falls Due to No Fall Risks No Fall Risks No Fall Risks  Fall risk Follow up Falls evaluation completed Falls evaluation completed Falls evaluation completed   Functional Status Survey:    Vitals:   02/24/24 1359  BP: 108/72  Pulse: 84  Resp: 18  Temp: 97.7 F (36.5 C)  SpO2: 95%  Weight: 98 lb 3.2 oz (44.5 kg)  Height: 4' 11 (1.499 m)   Body mass index is 19.83 kg/m. Physical Exam  VITALS: T- 97.7, BP- 108/72, SaO2- 95% MEASUREMENTS: Weight- 98. GENERAL: Alert, cooperative, well developed, no acute distress. HEENT: Normocephalic, normal oropharynx, moist mucous membranes. Tympanic membranes normal bilaterally. Nasal congestion on the left side. Pharynx erythematous. Mild sinus tenderness  present. NECK: Cervical lymphadenopathy present. CHEST: Clear to auscultation bilaterally. No wheezes, rhonchi, or crackles. CARDIOVASCULAR: Normal heart rate and rhythm. S1 and S2 normal without murmurs. ABDOMEN: Soft, non-tender, non-distended. Splenomegaly present. Normal bowel sounds. EXTREMITIES: No cyanosis or edema. NEUROLOGICAL: Cranial nerves grossly intact. Moves all extremities without gross motor or sensory deficit.   Labs reviewed: Recent Labs    11/23/23 1135 01/18/24 1105 02/23/24 1336  NA 138 140 139  K 4.3 4.2 4.4  CL 103 104 104  CO2 31 30 32  GLUCOSE 97 105* 88  BUN 22 16 14   CREATININE 0.73 0.75 0.62  CALCIUM  9.3 9.0 9.1   Recent Labs    08/24/23 1143 10/28/23 1411  11/23/23 1135 01/18/24 1105 02/23/24 1336  AST 42*   < > 59* 53* 55*  ALT 28   < > 39 37* 39  ALKPHOS 97  --  107  --  111  BILITOT 0.5   < > 0.5 0.6 0.6  PROT 6.5   < > 6.9 6.3 6.5  ALBUMIN 3.8  --  3.9  --  3.7   < > = values in this interval not displayed.   Recent Labs    10/04/23 1048 11/23/23 1135 02/23/24 1336  WBC 7.0 6.6 7.5  NEUTROABS 1,365* 1.3* 1.4*  HGB 12.4 12.8 13.0  HCT 37.6 37.5 38.5  MCV 90.6 89.5 90.2  PLT 91* 87* 82*   Lab Results  Component Value Date   TSH 1.66 10/04/2023   Lab Results  Component Value Date   HGBA1C 5.9 (H) 10/04/2023   Lab Results  Component Value Date   CHOL 183 01/18/2024   HDL 50 01/18/2024   LDLCALC 110 (H) 01/18/2024   TRIG 124 01/18/2024   CHOLHDL 3.7 01/18/2024    Significant Diagnostic Results in last 30 days:  No results found.  Assessment/Plan    Chronic lymphocytic leukemia with splenomegaly, lymphadenopathy, and secondary thrombocytopenia Chronic lymphocytic leukemia with associated splenomegaly and lymphadenopathy. Secondary thrombocytopenia with low platelet count. Spleen is enlarged and firm. - she will Initiate treatment with Calquence  and allopurinol  on Monday. - Follow up with oncologist on September 9th.  Acute pharyngitis and upper respiratory symptoms Acute pharyngitis with sore throat, nasal congestion, and mild sinus tenderness. Symptoms started less than 24 hours ago. No fever or significant systemic symptoms. White blood cell count is normal. - Declined antibiotics  - Gargle with warm salt water. - Use throat coat tea with slippery elm. - Monitor symptoms and call if fever or symptoms worsen. - Delay Prolia  injection until symptoms resolve, tentatively reschedule for next week. - Consider antibiotics if symptoms worsen or fever develops.she will call office if symptoms worsen   Osteoporosis Osteoporosis management with planned initiation of Prolia . First dose of Prolia  delayed due to acute  pharyngitis and upper respiratory symptoms to avoid introducing new treatment during acute illness. - Reschedule Prolia  injection for next week, tentatively August 28th.  Elevated liver enzymes Mildly elevated AST, previously higher. - Monitor liver enzymes with upcoming labs during oncologist follow-up.    Family/ staff Communication: Reviewed plan of care with patient verbalized understanding   Labs/tests ordered: None   Next Appointment: Return in about 1 week (around 03/02/2024) for Prolia  .   Total time: 20 minutes. Greater than 50% of total time spent doing patient education regarding Acute pharyngitis,Osteoporosis,Elevated Liver enzymes,CLL, health maintenance including symptom/medication management.   Roxan JAYSON Plough, NP

## 2024-02-24 NOTE — Patient Instructions (Signed)
-   Notify provider if symptoms worsen or running any fever or chills

## 2024-02-24 NOTE — Telephone Encounter (Signed)
 Prescription was modified from Calquence  capsules to Calquence  tablets by pharmacy oncology resident due to calquence  capsules being unavailable.  Georganne Siple, PharmD Hematology/Oncology Clinical Pharmacist [facility] [phone]

## 2024-02-24 NOTE — Telephone Encounter (Signed)
 FYI Only or Action Required?: FYI only for provider.  Patient was last seen in primary care on 01/24/2024 by Caro Harlene POUR, NP.  Called Nurse Triage reporting Sore Throat.  Symptoms began yesterday.  Interventions attempted: Nothing.  Symptoms are: gradually worsening.  Triage Disposition: See PCP When Office is Open (Within 3 Days)  Patient/caregiver understands and will follow disposition?: Yes     Summary: Needing advice on prolia  injection   Reason for Triage: [phone] (M) patient stated she is battling sore throat this morning after visiting her oncology appt yesterday, she stated she was supposed to start her prolia  injection today, but she is unsure if she should do the injection or have a visit with the doctor today  please call the patient and advise.     Reason for Disposition  [1] Sore throat is the only symptom AND [2] present > 48 hours  Answer Assessment - Initial Assessment Questions 1. ONSET: When did the throat start hurting? (Hours or days ago)      yesterday 2. SEVERITY: How bad is the sore throat? (Scale 1-10; mild, moderate or severe)     mild 3. STREP EXPOSURE: Has there been any exposure to strep within the past week? If Yes, ask: What type of contact occurred?      na 4.  VIRAL SYMPTOMS: Are there any symptoms of a cold, such as a runny nose, cough, hoarse voice or red eyes?      Stuffy nose, yellow mucous in throat this morning 5. FEVER: Do you have a fever? If Yes, ask: What is your temperature, how was it measured, and when did it start?     no 6. PUS ON THE TONSILS: Is there pus on the tonsils in the back of your throat?     no 7. OTHER SYMPTOMS: Do you have any other symptoms? (e.g., difficulty breathing, headache, rash)     no 8. PREGNANCY: Is there any chance you are pregnant? When was your last menstrual period?     no  Protocols used: Sore Throat-A-AH

## 2024-02-25 ENCOUNTER — Ambulatory Visit

## 2024-03-01 NOTE — Progress Notes (Signed)
 HEMATOLOGY/ONCOLOGY CLINIC VISIT NOTE  Date of Service:.02/23/2024  Patient Care Team: Caro Harlene POUR, NP as PCP - General (Geriatric Medicine) [name], MD as Consulting Physician (Hematology)  CHIEF COMPLAINTS/PURPOSE OF CONSULTATION:  Follow-up for continued valuation management of CLL/SLL and treatment initiation.  HISTORY OF PRESENTING ILLNESS:   See previous notes for details on initial presentation  Interval history  Nick Armel is here for her scheduled 68-month follow-up for CLL/SLL. She notes that her cervical lymphadenopathy continues to progress and so does her adenopathy in the axilla. She notes that she thought about the treatment options that we discussed on the last clinic visit and has decided to initiate treatment for her CLL with BTK inhibitors.  We have sent in a prescription for Calquence  which she is to start taking after her counseling sessions today. She notes no fevers or chills, drenching night sweats, unexpected sudden weight loss. Notes no new shortness of breath or chest pain.  She continues to have abdominal distention and fullness from her significant splenomegaly.  MEDICAL HISTORY:  Past Medical History:  Diagnosis Date   Anemia    Arthritis    Cataract surgeries 1/28, 08/17/2023   CLL (chronic lymphocytic leukemia) (HCC)    Hirsutism    Hypercholesteremia    Osteoporosis 10/20/2016   T score -2.8   Thyroid  disease     SURGICAL HISTORY: Past Surgical History:  Procedure Laterality Date   BASAL CELL CARCINOMA EXCISION  12/2015   CATARACT EXTRACTION, BILATERAL     08-03-23 & 08-17-23   CESAREAN SECTION  1991   TONSILLECTOMY AND ADENOIDECTOMY N/A 1960    SOCIAL HISTORY: Social History   Socioeconomic History   Marital status: Married    Spouse name: Not on file   Number of children: Not on file   Years of education: Not on file   Highest education level: Associate degree: occupational, Scientist, product/process development, or  vocational program  Occupational History   Not on file  Tobacco Use   Smoking status: Never   Smokeless tobacco: Never  Vaping Use   Vaping status: Never Used  Substance and Sexual Activity   Alcohol use: Not Currently   Drug use: No   Sexual activity: Not Currently    Partners: Male    Birth control/protection: Post-menopausal    Comment: intercourse age 10, less than 5 sexual partners,des neg  Other Topics Concern   Not on file  Social History Narrative   Not on file   Social Drivers of Health   Financial Resource Strain: Low Risk  (09/29/2023)   Overall Financial Resource Strain (CARDIA)    Difficulty of Paying Living Expenses: Not very hard  Food Insecurity: No Food Insecurity (09/29/2023)   Hunger Vital Sign    Worried About Running Out of Food in the Last Year: Never true    Ran Out of Food in the Last Year: Never true  Transportation Needs: No Transportation Needs (09/29/2023)   PRAPARE - Administrator, Civil Service (Medical): No    Lack of Transportation (Non-Medical): No  Physical Activity: Insufficiently Active (09/29/2023)   Exercise Vital Sign    Days of Exercise per Week: 2 days    Minutes of Exercise per Session: 20 min  Stress: No Stress Concern Present (09/29/2023)   [employer] of Occupational Health - Occupational Stress Questionnaire    Feeling of Stress : Only a little  Social Connections: Socially Integrated (09/29/2023)   Social Connection and Isolation Panel  Frequency of Communication with Friends and Family: Twice a week    Frequency of Social Gatherings with Friends and Family: Once a week    Attends Religious Services: More than 4 times per year    Active Member of [employer] or Organizations: Yes    Attends Engineer, structural: More than 4 times per year    Marital Status: Married  Catering manager Violence: Not on file    FAMILY HISTORY: Family History  Problem Relation Age of Onset   Osteoporosis Mother    Memory  loss Mother    Dementia Mother    Heart failure Father    Irritable bowel syndrome Brother    Colon cancer Brother    Cancer Paternal Aunt        anal   Lung cancer Paternal Aunt   Sister was recently diagnosed with lymphoma  ALLERGIES:  is allergic to crestor [rosuvastatin] and gabapentin.  MEDICATIONS:  Current Outpatient Medications  Medication Sig Dispense Refill   acalabrutinib  maleate (CALQUENCE ) 100 MG tablet Take 1 tablet (100 mg total) by mouth 2 (two) times daily. 60 tablet 2   allopurinol  (ZYLOPRIM ) 100 MG tablet Take 1 tablet (100 mg total) by mouth daily. 60 tablet 1   Bacillus Coagulans-Inulin (PROBIOTIC-PREBIOTIC) 1-250 BILLION-MG CAPS Take by mouth.     CALCIUM  PO Take 400 mg by mouth 2 (two) times daily. Includes Vit D 1000, K1 100 mcg, and K 2 30 mg)     Ferrous Sulfate (IRON PO) Take 22 mg by mouth as directed. RAW IRON OTC  5 times weekly     Multiple Vitamin (MULTIVITAMIN) capsule Take 1 capsule by mouth daily.     Omega-3 Fatty Acids (FISH OIL PO) Take 1,000 mg by mouth daily. Pro-Omega     ondansetron  (ZOFRAN ) 8 MG tablet Take 1 tablet (8 mg total) by mouth every 8 (eight) hours as needed for nausea or vomiting. 20 tablet 0   thyroid  (ARMOUR THYROID ) 15 MG tablet Take 1 tablet (15 mg total) by mouth daily. 90 tablet 1   thyroid  (ARMOUR) 30 MG tablet Take 1 tablet (30 mg total) by mouth daily before breakfast. Take with 15 mg tablet 90 tablet 1   Current Facility-Administered Medications  Medication Dose Route Frequency Provider Last Rate Last Admin   denosumab  (PROLIA ) injection 60 mg  60 mg Subcutaneous Q6 months Eubanks, Jessica K, NP        REVIEW OF SYSTEMS:    .10 Point review of Systems was done is negative except as noted above.  PHYSICAL EXAMINATION: .BP 105/70   Pulse 66   Temp 97.7 F (36.5 C)   Resp 18   Wt 98 lb 3.2 oz (44.5 kg)   SpO2 97%   BMI 19.83 kg/m  . GENERAL:alert, in no acute distress and comfortable SKIN: no acute rashes,  no significant lesions EYES: conjunctiva are pink and non-injected, sclera anicteric OROPHARYNX: MMM, no exudates, no oropharyngeal erythema or ulceration NECK: supple, no JVD LYMPH: Extensive palpable bilateral cervical axillary and inguinal adenopathy LUNGS: clear to auscultation b/l with normal respiratory effort HEART: regular rate & rhythm ABDOMEN:  normoactive bowel sounds , non tender, distended with extensive splenomegaly. Extremity: no pedal edema PSYCH: alert & oriented x 3 with fluent speech NEURO: no focal motor/sensory deficits   LABORATORY DATA:  I have reviewed the data as listed  .    Latest Ref Rng & Units 02/23/2024    1:36 PM 11/23/2023   11:35 AM 10/04/2023  10:48 AM  CBC  WBC 4.0 - 10.5 K/uL 7.5  6.6  7.0   Hemoglobin 12.0 - 15.0 g/dL 86.9  87.1  87.5   Hematocrit 36.0 - 46.0 % 38.5  37.5  37.6   Platelets 150 - 400 K/uL 82  87  91    .CBC    Component Value Date/Time   WBC 7.5 02/23/2024 1336   WBC 7.0 10/04/2023 1048   RBC 4.27 02/23/2024 1336   HGB 13.0 02/23/2024 1336   HCT 38.5 02/23/2024 1336   PLT 82 (L) 02/23/2024 1336   MCV 90.2 02/23/2024 1336   MCH 30.4 02/23/2024 1336   MCHC 33.8 02/23/2024 1336   RDW 15.2 02/23/2024 1336   LYMPHSABS 5.6 (H) 02/23/2024 1336   MONOABS 0.4 02/23/2024 1336   EOSABS 0.1 02/23/2024 1336   BASOSABS 0.0 02/23/2024 1336    .    Latest Ref Rng & Units 02/23/2024    1:36 PM 01/18/2024   11:05 AM 11/23/2023   11:35 AM  CMP  Glucose 70 - 99 mg/dL 88  894  97   BUN 8 - 23 mg/dL 14  16  22    Creatinine 0.44 - 1.00 mg/dL 9.37  9.24  9.26   Sodium 135 - 145 mmol/L 139  140  138   Potassium 3.5 - 5.1 mmol/L 4.4  4.2  4.3   Chloride 98 - 111 mmol/L 104  104  103   CO2 22 - 32 mmol/L 32  30  31   Calcium  8.9 - 10.3 mg/dL 9.1  9.0  9.3   Total Protein 6.5 - 8.1 g/dL 6.5  6.3  6.9   Total Bilirubin 0.0 - 1.2 mg/dL 0.6  0.6  0.5   Alkaline Phos 38 - 126 U/L 111   107   AST 15 - 41 U/L 55  53  59   ALT 0 - 44 U/L  39  37  39    . Lab Results  Component Value Date   LDH 187 02/23/2024      10/16/2022 Fish panel:  RADIOGRAPHIC STUDIES: I have personally reviewed the radiological images as listed and agreed with the findings in the report. No results found.   ASSESSMENT & PLAN:   68 year old female with a history of previous pulmonary sarcoidosis, dyslipidemia, osteoporosis with  #1  Recently diagnosed CLL/SLL based on biopsy of incidentally noted persistence left axillary lymphadenopathy on routine mammogram. Labs today showed lymphocytosis of 5.6k Clinically patient has a few small cervical lymph nodes and bilateral axillary lymph nodes.  #2 mild thrombocytopenia 78k  PLAN:. - Discussed lab results in detail with the patient from today at 02/23/2024 CBC shows normal WBC count of 7.5.,  Normal hemoglobin of 13 and, cytopenia with platelets.  Gradually coming down at 82k, mild neutropenia with ANC of 1.4k CMP stable Patient has decided to proceed with BTK negative therapy. We shall start her on Calquence  100 mg p.o. daily for the first 3 to 4 weeks and if tolerated well this will be increased to 100 mg p.o. twice daily. Will start on allopurinol  for TLS prophylaxis And Zofran  as needed for nausea Was recommended to drink at least 2 to 3 L of water daily Continue vitamin D  at least 2000 units daily and B complex. She was counseled about the potential side effects by me as well as the pharmacist.  FOLLOW-UP:  RTC with Dr Onesimo with labs in 3 weeks   .The total time spent in  the appointment was 30 minutes* .  All of the patient's questions were answered with apparent satisfaction. The patient knows to call the clinic with any problems, questions or concerns.   Emaline Saran MD MS AAHIVMS [facility] Hematology/Oncology Physician [facility]  .*Total Encounter Time as defined by the Centers for Medicare and Medicaid Services includes, in addition to the face-to-face time of a  patient visit (documented in the note above) non-face-to-face time: obtaining and reviewing outside history, ordering and reviewing medications, tests or procedures, care coordination (communications with other health care professionals or caregivers) and documentation in the medical record.

## 2024-03-02 ENCOUNTER — Ambulatory Visit (INDEPENDENT_AMBULATORY_CARE_PROVIDER_SITE_OTHER)

## 2024-03-02 DIAGNOSIS — M810 Age-related osteoporosis without current pathological fracture: Secondary | ICD-10-CM

## 2024-03-02 MED ORDER — DENOSUMAB 60 MG/ML ~~LOC~~ SOSY: 60.0000 mg | PREFILLED_SYRINGE | SUBCUTANEOUS | Status: AC

## 2024-03-02 NOTE — Progress Notes (Signed)
 Patient is in office today for a nurse visit for Prolia  injection for the first time. Patient Injection was given in the  Left arm. Patient tolerated injection well.

## 2024-03-07 ENCOUNTER — Encounter: Payer: Self-pay | Admitting: Hematology

## 2024-03-09 ENCOUNTER — Other Ambulatory Visit: Payer: Self-pay

## 2024-03-14 ENCOUNTER — Inpatient Hospital Stay

## 2024-03-14 ENCOUNTER — Ambulatory Visit: Admitting: Hematology

## 2024-03-14 ENCOUNTER — Other Ambulatory Visit: Payer: Self-pay

## 2024-03-27 ENCOUNTER — Other Ambulatory Visit: Payer: Self-pay

## 2024-03-27 NOTE — Progress Notes (Signed)
 Specialty Pharmacy Ongoing Clinical Assessment Note  Rebecca Aguirre is a 68 y.o. female who is being followed by the specialty pharmacy service for RxSp Oncology   Patient's specialty medication(s) reviewed today: Acalabrutinib  Maleate (CALQUENCE )   Missed doses in the last 4 weeks: 0   Patient/Caregiver did not have any additional questions or concerns.   Therapeutic benefit summary: Unable to assess   Adverse events/side effects summary: Experienced adverse events/side effects (intermittent, tolerable headache)   Patient's therapy is appropriate to: Continue    Goals Addressed             This Visit's Progress    Maintain optimal adherence to therapy       Patient is on track. Patient will maintain adherence          Follow up: 3 months  Rosaleigh Brazzel M Mae Denunzio Specialty Pharmacist

## 2024-03-31 ENCOUNTER — Other Ambulatory Visit: Payer: Self-pay

## 2024-03-31 DIAGNOSIS — C9110 Chronic lymphocytic leukemia of B-cell type not having achieved remission: Secondary | ICD-10-CM

## 2024-04-03 ENCOUNTER — Other Ambulatory Visit

## 2024-04-03 ENCOUNTER — Inpatient Hospital Stay: Attending: Hematology

## 2024-04-03 ENCOUNTER — Inpatient Hospital Stay (HOSPITAL_BASED_OUTPATIENT_CLINIC_OR_DEPARTMENT_OTHER): Admitting: Hematology

## 2024-04-03 VITALS — BP 102/75 | HR 71 | Temp 97.0°F | Resp 18 | Wt 95.1 lb

## 2024-04-03 DIAGNOSIS — C9110 Chronic lymphocytic leukemia of B-cell type not having achieved remission: Secondary | ICD-10-CM | POA: Diagnosis not present

## 2024-04-03 DIAGNOSIS — R591 Generalized enlarged lymph nodes: Secondary | ICD-10-CM | POA: Insufficient documentation

## 2024-04-03 DIAGNOSIS — M810 Age-related osteoporosis without current pathological fracture: Secondary | ICD-10-CM | POA: Diagnosis not present

## 2024-04-03 DIAGNOSIS — D696 Thrombocytopenia, unspecified: Secondary | ICD-10-CM

## 2024-04-03 DIAGNOSIS — Z801 Family history of malignant neoplasm of trachea, bronchus and lung: Secondary | ICD-10-CM | POA: Insufficient documentation

## 2024-04-03 DIAGNOSIS — Z800 Family history of malignant neoplasm of digestive organs: Secondary | ICD-10-CM | POA: Diagnosis not present

## 2024-04-03 DIAGNOSIS — Z79899 Other long term (current) drug therapy: Secondary | ICD-10-CM | POA: Diagnosis not present

## 2024-04-03 LAB — CBC WITH DIFFERENTIAL (CANCER CENTER ONLY)
Abs Immature Granulocytes: 0.01 K/uL (ref 0.00–0.07)
Basophils Absolute: 0.1 K/uL (ref 0.0–0.1)
Basophils Relative: 0 %
Eosinophils Absolute: 0.1 K/uL (ref 0.0–0.5)
Eosinophils Relative: 0 %
HCT: 40.4 % (ref 36.0–46.0)
Hemoglobin: 13.5 g/dL (ref 12.0–15.0)
Immature Granulocytes: 0 %
Lymphocytes Relative: 84 %
Lymphs Abs: 9.7 K/uL — ABNORMAL HIGH (ref 0.7–4.0)
MCH: 30.7 pg (ref 26.0–34.0)
MCHC: 33.4 g/dL (ref 30.0–36.0)
MCV: 91.8 fL (ref 80.0–100.0)
Monocytes Absolute: 0.3 K/uL (ref 0.1–1.0)
Monocytes Relative: 3 %
Neutro Abs: 1.5 K/uL — ABNORMAL LOW (ref 1.7–7.7)
Neutrophils Relative %: 13 %
Platelet Count: 55 K/uL — ABNORMAL LOW (ref 150–400)
RBC: 4.4 MIL/uL (ref 3.87–5.11)
RDW: 14.8 % (ref 11.5–15.5)
Smear Review: NORMAL
WBC Count: 11.6 K/uL — ABNORMAL HIGH (ref 4.0–10.5)
nRBC: 0 % (ref 0.0–0.2)

## 2024-04-03 LAB — CMP (CANCER CENTER ONLY)
ALT: 52 U/L — ABNORMAL HIGH (ref 0–44)
AST: 57 U/L — ABNORMAL HIGH (ref 15–41)
Albumin: 4 g/dL (ref 3.5–5.0)
Alkaline Phosphatase: 111 U/L (ref 38–126)
Anion gap: 4 — ABNORMAL LOW (ref 5–15)
BUN: 17 mg/dL (ref 8–23)
CO2: 32 mmol/L (ref 22–32)
Calcium: 9.4 mg/dL (ref 8.9–10.3)
Chloride: 103 mmol/L (ref 98–111)
Creatinine: 0.73 mg/dL (ref 0.44–1.00)
GFR, Estimated: >60 mL/min (ref >60)
Glucose, Bld: 101 mg/dL — ABNORMAL HIGH (ref 70–99)
Potassium: 4.3 mmol/L (ref 3.5–5.1)
Sodium: 139 mmol/L (ref 135–145)
Total Bilirubin: 0.7 mg/dL (ref 0.0–1.2)
Total Protein: 6.9 g/dL (ref 6.5–8.1)

## 2024-04-03 LAB — LACTATE DEHYDROGENASE: LDH: 164 U/L (ref 98–192)

## 2024-04-03 NOTE — Progress Notes (Signed)
 HEMATOLOGY ONCOLOGY PROGRESS NOTE  Date of service: 04/03/2024  Patient Care Team: Caro Harlene POUR, NP as PCP - General (Geriatric Medicine) [name], MD as Consulting Physician (Hematology)  CHIEF COMPLAINT/PURPOSE OF CONSULTATION: Continued evaluation and management of CLL  HISTORY OF PRESENTING ILLNESS: Please see previous note for details on initial presentation.   SUMMARY OF ONCOLOGIC HISTORY: Oncology History   No history exists.    Interval History  Recommended evaluation and management of her CLL and toxicity check after having started Calquence  100 mg p.o. daily.  She presents with a mask to prevent further risks. She states that she is tolerating Calequence. She mentions having lost some weight and relieved pain in her spleen. She mentions headaches. She drinks tea and coffee. She mentions sores in her mouth. She mentions past teeth grinding and no longer using mouth guard. She mentions a blood blister in her moth She mentions few of them and using peroxide to see if blood or not. Dr. Onesimo suggest keeping mouth moisturized   She mentions a bit of bruising and 2 bruises on her right leg upper thigh. She mentions bumping into items. Low plts could be 55,000 and hold of on increasing Lymph node sites may increase while spleen shrinks and is expected. She states taking Iron 5x a week, and multivitamin. She states vitamin K is in her diet.  Dr.Jennise Both does recommend more leafy greens and to continue incorporating vitamin K in her diet.   She has mentions an improved appetite and regulated bowel movements. No new leg swelling and she does have prominent veins and is suggested to stay moisturized.  3 possible cause of Lower white plt count, cll cells and clones can create abnormal antibodies and cause an immune process or hemoglobin anemia. Calequence .   Weight loss is more than likely from drainage of lymphnodes.   No regular alcohol consumption, recreationally    Lymph nodes have flattened.  Bruising on extremities (she mentions on legs and arms) Under the bruising she felt thickened area under blister which has since resolved.   She question past blood work and an explanation of results was shared with pt. She ask of her IGG levels and IGG levels are normal.    Recommends flu vaccination for pt and she states worry regarding possibility of immune systems strength for Flu vaccination.   She states being able to touch people in her daily living due to immune system and states wearing mask depending on size of location and amount of people.  Plan to see Dermatologist with skin issues  Recommend to use compression socks and compression sleeves for lower and upper extremity bruises.   No infections issues   4-6 weeks and based on stability will see if dosage needs to be increased. Current dosage is lower than the standard   She asks about treatment and if she is in remission,. Response parameters if in remission, how long does medication work and how long do people stay on it. Her questions are answered regarding remission, her blood counts are being monitored along with lymph nodes.  Dr.Emera Bussie asks of any rashes and states no no. She uric acid level is steady and  will be started in 4-6 weeks and extract liver enzymes.   Dr. Onesimo states Thyroid  dysfunction can cause fatty liver and she states she has taken the medication for 2 years.   Filed Weights   04/03/24 1135  Weight: 95 lb 1.6 oz (43.1 kg)    BP  Readings from Last 3 Encounters:  04/03/24 102/75  02/24/24 108/72  02/23/24 105/70    CBC    Component Value Date/Time   WBC 11.6 (H) 04/03/2024 1108   WBC 7.0 10/04/2023 1048   RBC 4.40 04/03/2024 1108   HGB 13.5 04/03/2024 1108   HCT 40.4 04/03/2024 1108   PLT 55 (L) 04/03/2024 1108   MCV 91.8 04/03/2024 1108   MCH 30.7 04/03/2024 1108   MCHC 33.4 04/03/2024 1108   RDW 14.8 04/03/2024 1108   LYMPHSABS PENDING 04/03/2024 1108    MONOABS PENDING 04/03/2024 1108   EOSABS PENDING 04/03/2024 1108   BASOSABS PENDING 04/03/2024 1108       Latest Ref Rng & Units 02/23/2024    1:36 PM 01/18/2024   11:05 AM 11/23/2023   11:35 AM  CMP  Glucose 70 - 99 mg/dL 88  894  97   BUN 8 - 23 mg/dL 14  16  22    Creatinine 0.44 - 1.00 mg/dL 9.37  9.24  9.26   Sodium 135 - 145 mmol/L 139  140  138   Potassium 3.5 - 5.1 mmol/L 4.4  4.2  4.3   Chloride 98 - 111 mmol/L 104  104  103   CO2 22 - 32 mmol/L 32  30  31   Calcium  8.9 - 10.3 mg/dL 9.1  9.0  9.3   Total Protein 6.5 - 8.1 g/dL 6.5  6.3  6.9   Total Bilirubin 0.0 - 1.2 mg/dL 0.6  0.6  0.5   Alkaline Phos 38 - 126 U/L 111   107   AST 15 - 41 U/L 55  53  59   ALT 0 - 44 U/L 39  37  39         Latest Ref Rng & Units 04/03/2024   11:08 AM 02/23/2024    1:36 PM 11/23/2023   11:35 AM  CBC  WBC 4.0 - 10.5 K/uL 11.6  7.5  6.6   Hemoglobin 12.0 - 15.0 g/dL 86.4  86.9  87.1   Hematocrit 36.0 - 46.0 % 40.4  38.5  37.5   Platelets 150 - 400 K/uL 55  82  87     REVIEW OF SYSTEMS:    10 Point review of systems of done and is negative except as noted above.  MEDICAL HISTORY Past Medical History:  Diagnosis Date   Anemia    Arthritis    Cataract surgeries 1/28, 08/17/2023   CLL (chronic lymphocytic leukemia) (HCC)    Hirsutism    Hypercholesteremia    Osteoporosis 10/20/2016   T score -2.8   Thyroid  disease     SURGICAL HISTORY Past Surgical History:  Procedure Laterality Date   BASAL CELL CARCINOMA EXCISION  12/2015   CATARACT EXTRACTION, BILATERAL     08-03-23 & 08-17-23   CESAREAN SECTION  1991   TONSILLECTOMY AND ADENOIDECTOMY N/A 1960    SOCIAL HISTORY Social History   Tobacco Use   Smoking status: Never   Smokeless tobacco: Never  Vaping Use   Vaping status: Never Used  Substance Use Topics   Alcohol use: Not Currently   Drug use: No    Social History   Social History Narrative   Not on file    SOCIAL DRIVERS OF HEALTH SDOH Screenings   Food  Insecurity: No Food Insecurity (09/29/2023)  Housing: Low Risk  (09/29/2023)  Transportation Needs: No Transportation Needs (09/29/2023)  Depression (PHQ2-9): Low Risk  (02/24/2024)  Financial Resource Strain: Low  Risk  (09/29/2023)  Physical Activity: Insufficiently Active (09/29/2023)  Social Connections: Socially Integrated (09/29/2023)  Stress: No Stress Concern Present (09/29/2023)  Tobacco Use: Low Risk  (02/24/2024)     FAMILY HISTORY Family History  Problem Relation Age of Onset   Osteoporosis Mother    Memory loss Mother    Dementia Mother    Heart failure Father    Irritable bowel syndrome Brother    Colon cancer Brother    Cancer Paternal Aunt        anal   Lung cancer Paternal Aunt      ALLERGIES: is allergic to crestor [rosuvastatin] and gabapentin.  MEDICATIONS  Current Outpatient Medications  Medication Sig Dispense Refill   acalabrutinib  maleate (CALQUENCE ) 100 MG tablet Take 1 tablet (100 mg total) by mouth 2 (two) times daily. 60 tablet 2   allopurinol  (ZYLOPRIM ) 100 MG tablet Take 1 tablet (100 mg total) by mouth daily. 60 tablet 1   Bacillus Coagulans-Inulin (PROBIOTIC-PREBIOTIC) 1-250 BILLION-MG CAPS Take by mouth.     CALCIUM  PO Take 400 mg by mouth 2 (two) times daily. Includes Vit D 1000, K1 100 mcg, and K 2 30 mg)     Ferrous Sulfate (IRON PO) Take 22 mg by mouth as directed. RAW IRON OTC  5 times weekly     Multiple Vitamin (MULTIVITAMIN) capsule Take 1 capsule by mouth daily.     Omega-3 Fatty Acids (FISH OIL PO) Take 1,000 mg by mouth daily. Pro-Omega     ondansetron  (ZOFRAN ) 8 MG tablet Take 1 tablet (8 mg total) by mouth every 8 (eight) hours as needed for nausea or vomiting. 20 tablet 0   thyroid  (ARMOUR THYROID ) 15 MG tablet Take 1 tablet (15 mg total) by mouth daily. 90 tablet 1   thyroid  (ARMOUR) 30 MG tablet Take 1 tablet (30 mg total) by mouth daily before breakfast. Take with 15 mg tablet 90 tablet 1   Current Facility-Administered Medications   Medication Dose Route Frequency Provider Last Rate Last Admin   [START ON 09/05/2024] denosumab  (PROLIA ) injection 60 mg  60 mg Subcutaneous Q6 months Eubanks, Jessica K, NP        PHYSICAL EXAMINATION: ECOG PERFORMANCE STATUS: 1 - Symptomatic but completely ambulatory BP 102/75   Pulse 71   Temp (!) 97 F (36.1 C)   Resp 18   Wt 95 lb 1.6 oz (43.1 kg)   SpO2 97%   BMI 19.21 kg/m  GENERAL:alert, in no acute distress and comfortable SKIN: no acute rashes, no significant lesions EYES: conjunctiva are pink and non-injected, sclera anicteric OROPHARYNX: MMM, no exudates, no oropharyngeal erythema or ulceration NECK: supple, no JVD LYMPH: Palpable lymphadenopathy in the neck axilla and groin  LUNGS: clear to auscultation b/l with normal respiratory effort HEART: regular rate & rhythm ABDOMEN:  normoactive bowel sounds , non tender, palpable splenomegaly present Extremity: no pedal edema PSYCH: alert & oriented x 3 with fluent speech NEURO: no focal motor/sensory deficits  LABORATORY DATA:   I have reviewed the data as listed     Latest Ref Rng & Units 04/03/2024   11:08 AM 02/23/2024    1:36 PM 11/23/2023   11:35 AM  CBC EXTENDED  WBC 4.0 - 10.5 K/uL 11.6  7.5  6.6   RBC 3.87 - 5.11 MIL/uL 4.40  4.27  4.19   Hemoglobin 12.0 - 15.0 g/dL 86.4  86.9  87.1   HCT 36.0 - 46.0 % 40.4  38.5  37.5   Platelets 150 -  400 K/uL 55  82  87   NEUT# 1.7 - 7.7 K/uL 1.5  1.4  1.3   Lymph# 0.7 - 4.0 K/uL 9.7  5.6  4.8        Latest Ref Rng & Units 09/22/2021   12:53 PM  Immunoglobulin Electrophoresis  IgG 586 - 1,602 mg/dL 8,768   IgM 26 - 782 mg/dL 51        Latest Ref Rng & Units 04/03/2024   11:08 AM 02/23/2024    1:36 PM 01/18/2024   11:05 AM  CMP  Glucose 70 - 99 mg/dL 898  88  894   BUN 8 - 23 mg/dL 17  14  16    Creatinine 0.44 - 1.00 mg/dL 9.26  9.37  9.24   Sodium 135 - 145 mmol/L 139  139  140   Potassium 3.5 - 5.1 mmol/L 4.3  4.4  4.2   Chloride 98 - 111 mmol/L 103  104   104   CO2 22 - 32 mmol/L 32  32  30   Calcium  8.9 - 10.3 mg/dL 9.4  9.1  9.0   Total Protein 6.5 - 8.1 g/dL 6.9  6.5  6.3   Total Bilirubin 0.0 - 1.2 mg/dL 0.7  0.6  0.6   Alkaline Phos 38 - 126 U/L 111  111    AST 15 - 41 U/L 57  55  53   ALT 0 - 44 U/L 52  39  37      RADIOGRAPHIC STUDIES: I have personally reviewed the radiological images as listed and agreed with the findings in the report. No results found.  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  PLAN: Patient's labs from today were discussed with her in details CBC shows normal hemoglobin of 13.5, WBC count of 11.6 and platelets of 55k CMP within normal limits LDH 164 Patient notes no overt bleeding or bruising.  No fevers no chills no night sweats.  Notes that her lymphadenopathy is improving. Continue allopurinol  100 mg p.o. daily Continue Calquence  at 100 mg p.o. daily at this time due to thrombocytopenia. Continue follow-up with dermatology for yearly skin screening  #3 osteoporosis - On Prolia  per primary care physician   FOLLOW-UP RTC with Dr Onesimo with labs in 5 weeks   The total time spent in the appointment was 30 minutes*.  All of the patient's questions were answered with apparent satisfaction. The patient knows to call the clinic with any problems, questions or concerns.   Emaline Onesimo MD MS AAHIVMS [facility] Hematology/Oncology Physician [facility]  .*Total Encounter Time as defined by the Centers for Medicare and Medicaid Services includes, in addition to the face-to-face time of a patient visit (documented in the note above)  non-face-to-face time: obtaining and reviewing outside history, ordering and reviewing medications, tests or procedures, care coordination (communications with other health care professionals or caregivers) and documentation in the medical record.  I,Emily Lagle,acting as a Neurosurgeon for Emaline Onesimo, MD.,have documented all relevant documentation on the behalf of Emaline Onesimo, MD,as directed by  Emaline Onesimo, MD while in the presence of Dacey Milberger,  MD.  I have reviewed the above documentation for accuracy and completeness, and I agree with the above.  Wynette Jersey, MD

## 2024-04-06 ENCOUNTER — Encounter: Payer: Self-pay | Admitting: Nurse Practitioner

## 2024-04-06 DIAGNOSIS — E039 Hypothyroidism, unspecified: Secondary | ICD-10-CM

## 2024-04-06 MED ORDER — THYROID 30 MG PO TABS: 30.0000 mg | ORAL_TABLET | Freq: Every day | ORAL | 1 refills | Status: AC

## 2024-04-06 MED ORDER — THYROID 15 MG PO TABS: 15.0000 mg | ORAL_TABLET | Freq: Every day | ORAL | 1 refills | Status: AC

## 2024-04-06 NOTE — Telephone Encounter (Signed)
 High risk or very high risk warning populated when attempting to refill medication. RX request sent to PCP for review and approval if warranted.

## 2024-04-07 ENCOUNTER — Telehealth: Payer: Self-pay

## 2024-04-07 NOTE — Telephone Encounter (Signed)
 Incoming fax received from [facility] and a message was sent to patient (waiting on a response)

## 2024-04-09 NOTE — Progress Notes (Incomplete)
 HEMATOLOGY ONCOLOGY PROGRESS NOTE  Date of service: 04/03/2024  Patient Care Team: Rebecca Harlene POUR, NP as PCP - General (Geriatric Medicine) Rebecca Emaline Brink, MD as Consulting Physician (Hematology)  CHIEF COMPLAINT/PURPOSE OF CONSULTATION: Continued evaluation and management of CLL  HISTORY OF PRESENTING ILLNESS: Please see previous note for details on initial presentation.   SUMMARY OF ONCOLOGIC HISTORY: Oncology History   No history exists.    Interval History  Recommended evaluation and management of her CLL and toxicity check after having started Calquence  100 mg p.o. daily.  She presents with a mask to prevent further risks. She states that she is tolerating Calequence. She mentions having lost some weight and relieved pain in her spleen. She mentions headaches. She drinks tea and coffee. She mentions sores in her mouth. She mentions past teeth grinding and no longer using mouth guard. She mentions a blood blister in her moth She mentions few of them and using peroxide to see if blood or not. Dr. Onesimo suggest keeping mouth moisturized   She mentions a bit of bruising and 2 bruises on her right leg upper thigh. She mentions bumping into items. Low plts could be 55,000 and hold of on increasing Lymph node sites may increase while spleen shrinks and is expected. She states taking Iron 5x a week, and multivitamin. She states vitamin K is in her diet.  Dr.Ameyah Bangura does recommend more leafy greens and to continue incorporating vitamin K in her diet.   She has mentions an improved appetite and regulated bowel movements. No new leg swelling and she does have prominent veins and is suggested to stay moisturized.  3 possible cause of Lower white plt count, cll cells and clones can create abnormal antibodies and cause an immune process or hemoglobin anemia. Calequence .   Weight loss is more than likely from drainage of lymphnodes.   No regular alcohol consumption, recreationally    Lymph nodes have flattened.  Bruising on extremities (she mentions on legs and arms) Under the bruising she felt thickened area under blister which has since resolved.   She question past blood work and an explanation of results was shared with pt. She ask of her IGG levels and IGG levels are normal.    Recommends flu vaccination for pt and she states worry regarding possibility of immune systems strength for Flu vaccination.   She states being able to touch people in her daily living due to immune system and states wearing mask depending on size of location and amount of people.  Plan to see Dermatologist with skin issues  Recommend to use compression socks and compression sleeves for lower and upper extremity bruises.   No infections issues   4-6 weeks and based on stability will see if dosage needs to be increased. Current dosage is lower than the standard   She asks about treatment and if she is in remission,. Response parameters if in remission, how long does medication work and how long do people stay on it. Her questions are answered regarding remission, her blood counts are being monitored along with lymph nodes.  Dr.Romelle Reiley asks of any rashes and states no no. She uric acid level is steady and  will be started in 4-6 weeks and extract liver enzymes.   Dr. Onesimo states Thyroid  dysfunction can cause fatty liver and she states she has taken the medication for 2 years.   Filed Weights   04/03/24 1135  Weight: 95 lb 1.6 oz (43.1 kg)    BP  Readings from Last 3 Encounters:  04/03/24 102/75  02/24/24 108/72  02/23/24 105/70    CBC    Component Value Date/Time   WBC 11.6 (H) 04/03/2024 1108   WBC 7.0 10/04/2023 1048   RBC 4.40 04/03/2024 1108   HGB 13.5 04/03/2024 1108   HCT 40.4 04/03/2024 1108   PLT 55 (L) 04/03/2024 1108   MCV 91.8 04/03/2024 1108   MCH 30.7 04/03/2024 1108   MCHC 33.4 04/03/2024 1108   RDW 14.8 04/03/2024 1108   LYMPHSABS PENDING 04/03/2024 1108    MONOABS PENDING 04/03/2024 1108   EOSABS PENDING 04/03/2024 1108   BASOSABS PENDING 04/03/2024 1108       Latest Ref Rng & Units 02/23/2024    1:36 PM 01/18/2024   11:05 AM 11/23/2023   11:35 AM  CMP  Glucose 70 - 99 mg/dL 88  894  97   BUN 8 - 23 mg/dL 14  16  22    Creatinine 0.44 - 1.00 mg/dL 9.37  9.24  9.26   Sodium 135 - 145 mmol/L 139  140  138   Potassium 3.5 - 5.1 mmol/L 4.4  4.2  4.3   Chloride 98 - 111 mmol/L 104  104  103   CO2 22 - 32 mmol/L 32  30  31   Calcium  8.9 - 10.3 mg/dL 9.1  9.0  9.3   Total Protein 6.5 - 8.1 g/dL 6.5  6.3  6.9   Total Bilirubin 0.0 - 1.2 mg/dL 0.6  0.6  0.5   Alkaline Phos 38 - 126 U/L 111   107   AST 15 - 41 U/L 55  53  59   ALT 0 - 44 U/L 39  37  39         Latest Ref Rng & Units 04/03/2024   11:08 AM 02/23/2024    1:36 PM 11/23/2023   11:35 AM  CBC  WBC 4.0 - 10.5 K/uL 11.6  7.5  6.6   Hemoglobin 12.0 - 15.0 g/dL 86.4  86.9  87.1   Hematocrit 36.0 - 46.0 % 40.4  38.5  37.5   Platelets 150 - 400 K/uL 55  82  87     REVIEW OF SYSTEMS:    10 Point review of systems of done and is negative except as noted above.  MEDICAL HISTORY Past Medical History:  Diagnosis Date  . Anemia   . Arthritis   . Cataract surgeries 1/28, 08/17/2023  . CLL (chronic lymphocytic leukemia) (HCC)   . Hirsutism   . Hypercholesteremia   . Osteoporosis 10/20/2016   T score -2.8  . Thyroid  disease     SURGICAL HISTORY Past Surgical History:  Procedure Laterality Date  . BASAL CELL CARCINOMA EXCISION  12/2015  . CATARACT EXTRACTION, BILATERAL     08-03-23 & 08-17-23  . CESAREAN SECTION  1991  . TONSILLECTOMY AND ADENOIDECTOMY N/A 1960    SOCIAL HISTORY Social History   Tobacco Use  . Smoking status: Never  . Smokeless tobacco: Never  Vaping Use  . Vaping status: Never Used  Substance Use Topics  . Alcohol use: Not Currently  . Drug use: No    Social History   Social History Narrative  . Not on file    SOCIAL DRIVERS OF HEALTH SDOH  Screenings   Food Insecurity: No Food Insecurity (09/29/2023)  Housing: Low Risk  (09/29/2023)  Transportation Needs: No Transportation Needs (09/29/2023)  Depression (PHQ2-9): Low Risk  (02/24/2024)  Financial Resource Strain: Low  Risk  (09/29/2023)  Physical Activity: Insufficiently Active (09/29/2023)  Social Connections: Socially Integrated (09/29/2023)  Stress: No Stress Concern Present (09/29/2023)  Tobacco Use: Low Risk  (02/24/2024)     FAMILY HISTORY Family History  Problem Relation Age of Onset  . Osteoporosis Mother   . Memory loss Mother   . Dementia Mother   . Heart failure Father   . Irritable bowel syndrome Brother   . Colon cancer Brother   . Cancer Paternal Aunt        anal  . Lung cancer Paternal Aunt      ALLERGIES: is allergic to crestor [rosuvastatin] and gabapentin.  MEDICATIONS  Current Outpatient Medications  Medication Sig Dispense Refill  . acalabrutinib  maleate (CALQUENCE ) 100 MG tablet Take 1 tablet (100 mg total) by mouth 2 (two) times daily. 60 tablet 2  . allopurinol  (ZYLOPRIM ) 100 MG tablet Take 1 tablet (100 mg total) by mouth daily. 60 tablet 1  . Bacillus Coagulans-Inulin (PROBIOTIC-PREBIOTIC) 1-250 BILLION-MG CAPS Take by mouth.    . CALCIUM  PO Take 400 mg by mouth 2 (two) times daily. Includes Vit D 1000, K1 100 mcg, and K 2 30 mg)    . Ferrous Sulfate (IRON PO) Take 22 mg by mouth as directed. RAW IRON OTC  5 times weekly    . Multiple Vitamin (MULTIVITAMIN) capsule Take 1 capsule by mouth daily.    . Omega-3 Fatty Acids (FISH OIL PO) Take 1,000 mg by mouth daily. Pro-Omega    . ondansetron  (ZOFRAN ) 8 MG tablet Take 1 tablet (8 mg total) by mouth every 8 (eight) hours as needed for nausea or vomiting. 20 tablet 0  . thyroid  (ARMOUR THYROID ) 15 MG tablet Take 1 tablet (15 mg total) by mouth daily. 90 tablet 1  . thyroid  (ARMOUR) 30 MG tablet Take 1 tablet (30 mg total) by mouth daily before breakfast. Take with 15 mg tablet 90 tablet 1    Current Facility-Administered Medications  Medication Dose Route Frequency Provider Last Rate Last Admin  . [START ON 09/05/2024] denosumab  (PROLIA ) injection 60 mg  60 mg Subcutaneous Q6 months Eubanks, Jessica K, NP        PHYSICAL EXAMINATION: ECOG PERFORMANCE STATUS: 1 - Symptomatic but completely ambulatory BP 102/75   Pulse 71   Temp (!) 97 F (36.1 C)   Resp 18   Wt 95 lb 1.6 oz (43.1 kg)   SpO2 97%   BMI 19.21 kg/m  GENERAL:alert, in no acute distress and comfortable SKIN: no acute rashes, no significant lesions EYES: conjunctiva are pink and non-injected, sclera anicteric OROPHARYNX: MMM, no exudates, no oropharyngeal erythema or ulceration NECK: supple, no JVD LYMPH: Palpable lymphadenopathy in the neck axilla and groin  LUNGS: clear to auscultation b/l with normal respiratory effort HEART: regular rate & rhythm ABDOMEN:  normoactive bowel sounds , non tender, palpable splenomegaly present Extremity: no pedal edema PSYCH: alert & oriented x 3 with fluent speech NEURO: no focal motor/sensory deficits  LABORATORY DATA:   I have reviewed the data as listed     Latest Ref Rng & Units 04/03/2024   11:08 AM 02/23/2024    1:36 PM 11/23/2023   11:35 AM  CBC EXTENDED  WBC 4.0 - 10.5 K/uL 11.6  7.5  6.6   RBC 3.87 - 5.11 MIL/uL 4.40  4.27  4.19   Hemoglobin 12.0 - 15.0 g/dL 86.4  86.9  87.1   HCT 36.0 - 46.0 % 40.4  38.5  37.5   Platelets 150 -  400 K/uL 55  82  87   NEUT# 1.7 - 7.7 K/uL 1.5  1.4  1.3   Lymph# 0.7 - 4.0 K/uL 9.7  5.6  4.8        Latest Ref Rng & Units 09/22/2021   12:53 PM  Immunoglobulin Electrophoresis  IgG 586 - 1,602 mg/dL 8,768   IgM 26 - 782 mg/dL 51        Latest Ref Rng & Units 04/03/2024   11:08 AM 02/23/2024    1:36 PM 01/18/2024   11:05 AM  CMP  Glucose 70 - 99 mg/dL 898  88  894   BUN 8 - 23 mg/dL 17  14  16    Creatinine 0.44 - 1.00 mg/dL 9.26  9.37  9.24   Sodium 135 - 145 mmol/L 139  139  140   Potassium 3.5 - 5.1 mmol/L 4.3   4.4  4.2   Chloride 98 - 111 mmol/L 103  104  104   CO2 22 - 32 mmol/L 32  32  30   Calcium  8.9 - 10.3 mg/dL 9.4  9.1  9.0   Total Protein 6.5 - 8.1 g/dL 6.9  6.5  6.3   Total Bilirubin 0.0 - 1.2 mg/dL 0.7  0.6  0.6   Alkaline Phos 38 - 126 U/L 111  111    AST 15 - 41 U/L 57  55  53   ALT 0 - 44 U/L 52  39  37      RADIOGRAPHIC STUDIES: I have personally reviewed the radiological images as listed and agreed with the findings in the report. No results found.  ASSESSMENT & PLAN:  68 y.o. female with    PLAN:  {ELdx:31163}   FOLLOW-UP in *** with {Provider Follow-up:31276}  I spent {CHL ONC TIME VISIT - DTPQU:8845999869} counseling the patient face to face. The total time spent in the appointment was {CHL ONC TIME VISIT - DTPQU:8845999869} and more than 50% was on counseling and direct patient cares.    Emaline Saran MD MS AAHIVMS [facility] Hematology/Oncology Physician [facility]  (Office):       [phone] (Work cell):  [phone] (Fax):           [phone]  I,Emily Lagle,acting as a scribe for Emaline Saran, MD.,have documented all relevant documentation on the behalf of Emaline Saran, MD,as directed by  Emaline Saran, MD while in the presence of Emaline Saran, MD.  I have reviewed the above documentation for accuracy and completeness, and I agree with the above.  Chaselyn Nanney, MD

## 2024-04-10 ENCOUNTER — Encounter: Payer: Self-pay | Admitting: Orthopedic Surgery

## 2024-04-10 ENCOUNTER — Ambulatory Visit (INDEPENDENT_AMBULATORY_CARE_PROVIDER_SITE_OTHER): Admitting: Orthopedic Surgery

## 2024-04-10 VITALS — BP 110/60 | HR 82 | Temp 97.5°F | Resp 16 | Ht 59.0 in | Wt 96.6 lb

## 2024-04-10 DIAGNOSIS — R233 Spontaneous ecchymoses: Secondary | ICD-10-CM

## 2024-04-10 DIAGNOSIS — R636 Underweight: Secondary | ICD-10-CM | POA: Diagnosis not present

## 2024-04-10 DIAGNOSIS — C9110 Chronic lymphocytic leukemia of B-cell type not having achieved remission: Secondary | ICD-10-CM

## 2024-04-10 DIAGNOSIS — K1379 Other lesions of oral mucosa: Secondary | ICD-10-CM | POA: Diagnosis not present

## 2024-04-10 NOTE — Patient Instructions (Addendum)
 Recommend soft bristled toothbrush  Rinse mouth with waterpik   Consider taking extra vitamin C 500 mg daily for immune boost  Ask Dr. Onesimo about flu vaccine> get by November 1st

## 2024-04-10 NOTE — Progress Notes (Signed)
 Careteam: Patient Care Team: Caro Harlene POUR, NP as PCP - General (Geriatric Medicine) [name], MD as Consulting Physician (Hematology)  Seen by: Greig Cluster, AGNP-C  PLACE OF SERVICE:  [facility]  Advanced Directive information Does Patient Have a Medical Advance Directive?: Yes, Type of Advance Directive: Healthcare Power of [place];Living will, Does patient want to make changes to medical advance directive?: No - Patient declined  Allergies  Allergen Reactions   Crestor [Rosuvastatin]     Leg cramps    Gabapentin Nausea And Vomiting    Chief Complaint  Patient presents with   Mass    Patient complains of bumps on her body since starting cancer treatment.     HPI: Patient is a 68 y.o. female seen today for acute visit due to abnormal bleeding.   Discussed the use of AI scribe software for clinical note transcription with the patient, who gave verbal consent to proceed.  History of Present Illness   Rebecca Aguirre is a 68 year old female with chronic lymphocytic leukemia who presents with concerns about bruising and marble-sized knots under the skin.  She is currently taking Calquence  100 mg daily. Bruising and marble-sized knots have developed on her legs and arms since starting the medication. The bruises are sometimes painful, and the knots occasionally disappear but leave discoloration. There has been no worsening of the bruising since her last visit with her oncologist on April 03, 2024.  The bruising and knots appear without significant trauma, although she has been moving and lifting boxes due to a recent separation from her husband. Even light taps can result in bruising. She has not tried hot or cold applications for the bruises. She reports that she has not noticed blood in her stool.  Her appetite is good, and she reports feeling hungry often. She weighs 96 pounds and is trying to gain weight. She avoids certain foods like bread and pasta  due to gas and bloating but is trying to reintroduce them.  She does report small black spots to inner mouth. She is using soft bristle toothbrush and electronic toothbrush at times. Denies pain or trauma to mouth.   She is currently living alone after a recent separation from her husband.       Review of Systems:  Review of Systems  Constitutional: Negative.   HENT:         Mouth sore  Respiratory: Negative.    Cardiovascular: Negative.   Skin:        Bruising, skin nodules  Psychiatric/Behavioral: Negative.      Past Medical History:  Diagnosis Date   Anemia    Arthritis    Cataract surgeries 1/28, 08/17/2023   CLL (chronic lymphocytic leukemia) (HCC)    Hirsutism    Hypercholesteremia    Osteoporosis 10/20/2016   T score -2.8   Thyroid  disease    Past Surgical History:  Procedure Laterality Date   BASAL CELL CARCINOMA EXCISION  12/2015   CATARACT EXTRACTION, BILATERAL     08-03-23 & 08-17-23   CESAREAN SECTION  1991   TONSILLECTOMY AND ADENOIDECTOMY N/A 1960   Social History:   reports that she has never smoked. She has never used smokeless tobacco. She reports that she does not currently use alcohol. She reports that she does not use drugs.  Family History  Problem Relation Age of Onset   Osteoporosis Mother    Memory loss Mother    Dementia Mother    Heart failure Father  Irritable bowel syndrome Brother    Colon cancer Brother    Cancer Paternal Aunt        anal   Lung cancer Paternal Aunt     Medications: Patient's Medications  New Prescriptions   No medications on file  Previous Medications   ACALABRUTINIB  MALEATE (CALQUENCE ) 100 MG TABLET    Take 1 tablet (100 mg total) by mouth 2 (two) times daily.   ALLOPURINOL  (ZYLOPRIM ) 100 MG TABLET    Take 1 tablet (100 mg total) by mouth daily.   BACILLUS COAGULANS-INULIN (PROBIOTIC-PREBIOTIC) 1-250 BILLION-MG CAPS    Take by mouth.   CALCIUM  PO    Take 400 mg by mouth 2 (two) times daily. Includes Vit D  1000, K1 100 mcg, and K 2 30 mg)   FERROUS SULFATE (IRON PO)    Take 22 mg by mouth as directed. RAW IRON OTC  5 times weekly   MULTIPLE VITAMIN (MULTIVITAMIN) CAPSULE    Take 1 capsule by mouth daily.   OMEGA-3 FATTY ACIDS (FISH OIL PO)    Take 1,000 mg by mouth daily. Pro-Omega   ONDANSETRON  (ZOFRAN ) 8 MG TABLET    Take 1 tablet (8 mg total) by mouth every 8 (eight) hours as needed for nausea or vomiting.   THYROID  (ARMOUR THYROID ) 15 MG TABLET    Take 1 tablet (15 mg total) by mouth daily.   THYROID  (ARMOUR) 30 MG TABLET    Take 1 tablet (30 mg total) by mouth daily before breakfast. Take with 15 mg tablet  Modified Medications   No medications on file  Discontinued Medications   No medications on file    Physical Exam:  Vitals:   04/10/24 1313  BP: 90/60  Pulse: 82  Resp: 16  Temp: (!) 97.5 F (36.4 C)  SpO2: 96%  Weight: 96 lb 9.6 oz (43.8 kg)  Height: 4' 11 (1.499 m)   Body mass index is 19.51 kg/m. Wt Readings from Last 3 Encounters:  04/10/24 96 lb 9.6 oz (43.8 kg)  04/03/24 95 lb 1.6 oz (43.1 kg)  02/24/24 98 lb 3.2 oz (44.5 kg)    Physical Exam Vitals reviewed.  Constitutional:      General: She is not in acute distress.    Appearance: She is underweight.  HENT:     Head: Normocephalic.  Eyes:     General:        Right eye: No discharge.        Left eye: No discharge.  Cardiovascular:     Rate and Rhythm: Normal rate and regular rhythm.     Pulses: Normal pulses.     Heart sounds: Normal heart sounds.  Pulmonary:     Effort: Pulmonary effort is normal.     Breath sounds: Normal breath sounds.  Abdominal:     Palpations: Abdomen is soft.  Musculoskeletal:        General: Normal range of motion.     Cervical back: Neck supple.  Skin:    General: Skin is warm.     Capillary Refill: Capillary refill takes less than 2 seconds.     Findings: Bruising and lesion present.     Comments: Multiple Bruises to upper and lower extremities, vary in size,  mild swelling palpated with bruising, appear non tender, no skin breakdown or deformity  Neurological:     General: No focal deficit present.     Mental Status: She is alert and oriented to person, place, and time.  Psychiatric:  Mood and Affect: Mood normal.     Labs reviewed: Basic Metabolic Panel: Recent Labs    07/05/23 0000 07/05/23 1151 08/24/23 1143 10/04/23 1048 10/28/23 1411 01/18/24 1105 02/23/24 1336 04/03/24 1108  NA  --   --    < >  --    < > 140 139 139  K  --   --    < >  --    < > 4.2 4.4 4.3  CL  --   --    < >  --    < > 104 104 103  CO2  --   --    < >  --    < > 30 32 32  GLUCOSE  --   --    < >  --    < > 105* 88 101*  BUN  --   --    < >  --    < > 16 14 17   CREATININE  --   --    < >  --    < > 0.75 0.62 0.73  CALCIUM   --   --    < >  --    < > 9.0 9.1 9.4  TSH 3.07 4.97  --  1.66  --   --   --   --    < > = values in this interval not displayed.   Liver Function Tests: Recent Labs    11/23/23 1135 01/18/24 1105 02/23/24 1336 04/03/24 1108  AST 59* 53* 55* 57*  ALT 39 37* 39 52*  ALKPHOS 107  --  111 111  BILITOT 0.5 0.6 0.6 0.7  PROT 6.9 6.3 6.5 6.9  ALBUMIN 3.9  --  3.7 4.0   No results for input(s): LIPASE, AMYLASE in the last 8760 hours. No results for input(s): AMMONIA in the last 8760 hours. CBC: Recent Labs    11/23/23 1135 02/23/24 1336 04/03/24 1108  WBC 6.6 7.5 11.6*  NEUTROABS 1.3* 1.4* 1.5*  HGB 12.8 13.0 13.5  HCT 37.5 38.5 40.4  MCV 89.5 90.2 91.8  PLT 87* 82* 55*   Lipid Panel: Recent Labs    10/04/23 1048 01/18/24 1105  CHOL 200* 183  HDL 52 50  LDLCALC 126* 110*  TRIG 111 124  CHOLHDL 3.8 3.7   TSH: Recent Labs    07/05/23 0000 07/05/23 1151 10/04/23 1048  TSH 3.07 4.97 1.66   A1C: Lab Results  Component Value Date   HGBA1C 5.9 (H) 10/04/2023     Assessment/Plan 1. Easy bruising (Primary) - onset > 1 week - upper and lower extremities involved, mild tenderness, no skin  breakdown - appears as normal bruising, suspect SE to Calquence  and low platelets  - recommend heat/ice applications to bruising  - scheduled to see dermatology this week  2. Mouth sore - mouth exam normal - discussed bleeding precautions associated to CLL treatment - recommend soft bristle toothbrush and water pik use  3. CLL (chronic lymphocytic leukemia) (HCC) - followed by Dr. Onesimo - see above - cont Calquence  - plans to ask about flu vaccine next visit   4. Underweight - BMI 19.51 - discussed calorie rich diet > 1500 per day    Total time: 31 minutes. Greater than 50% of total time spent doing patient education regarding health maintenance, bruising, mouth sores, diet and CLL including symptom/medication management.    Next appt: Visit date not found  Tabitha Tupper Gil, [facility]  [facility] [phone]

## 2024-04-13 DIAGNOSIS — L814 Other melanin hyperpigmentation: Secondary | ICD-10-CM | POA: Diagnosis not present

## 2024-04-13 DIAGNOSIS — L218 Other seborrheic dermatitis: Secondary | ICD-10-CM | POA: Diagnosis not present

## 2024-04-13 DIAGNOSIS — Z08 Encounter for follow-up examination after completed treatment for malignant neoplasm: Secondary | ICD-10-CM | POA: Diagnosis not present

## 2024-04-13 DIAGNOSIS — Z85828 Personal history of other malignant neoplasm of skin: Secondary | ICD-10-CM | POA: Diagnosis not present

## 2024-04-13 DIAGNOSIS — D1801 Hemangioma of skin and subcutaneous tissue: Secondary | ICD-10-CM | POA: Diagnosis not present

## 2024-04-13 DIAGNOSIS — L821 Other seborrheic keratosis: Secondary | ICD-10-CM | POA: Diagnosis not present

## 2024-04-17 ENCOUNTER — Other Ambulatory Visit: Payer: Self-pay

## 2024-04-17 NOTE — Progress Notes (Signed)
 Specialty Pharmacy Refill Coordination Note  Rebecca Aguirre is a 68 y.o. female contacted today regarding refills of specialty medication(s) Acalabrutinib  Maleate (CALQUENCE )   Patient requested [name] at [facility] [place] date: 04/19/24   Medication will be filled on 04/17/24.

## 2024-04-20 DIAGNOSIS — H43811 Vitreous degeneration, right eye: Secondary | ICD-10-CM | POA: Diagnosis not present

## 2024-05-01 DIAGNOSIS — H43811 Vitreous degeneration, right eye: Secondary | ICD-10-CM | POA: Diagnosis not present

## 2024-05-02 ENCOUNTER — Other Ambulatory Visit: Payer: Self-pay

## 2024-05-02 DIAGNOSIS — R161 Splenomegaly, not elsewhere classified: Secondary | ICD-10-CM

## 2024-05-02 DIAGNOSIS — C9110 Chronic lymphocytic leukemia of B-cell type not having achieved remission: Secondary | ICD-10-CM

## 2024-05-02 DIAGNOSIS — D696 Thrombocytopenia, unspecified: Secondary | ICD-10-CM

## 2024-05-03 ENCOUNTER — Inpatient Hospital Stay

## 2024-05-03 ENCOUNTER — Inpatient Hospital Stay: Attending: Hematology | Admitting: Hematology

## 2024-05-03 VITALS — BP 111/63 | HR 78 | Temp 97.7°F | Resp 20 | Wt 99.1 lb

## 2024-05-03 DIAGNOSIS — Z800 Family history of malignant neoplasm of digestive organs: Secondary | ICD-10-CM | POA: Diagnosis not present

## 2024-05-03 DIAGNOSIS — D696 Thrombocytopenia, unspecified: Secondary | ICD-10-CM | POA: Diagnosis not present

## 2024-05-03 DIAGNOSIS — R161 Splenomegaly, not elsewhere classified: Secondary | ICD-10-CM

## 2024-05-03 DIAGNOSIS — C9110 Chronic lymphocytic leukemia of B-cell type not having achieved remission: Secondary | ICD-10-CM

## 2024-05-03 DIAGNOSIS — Z801 Family history of malignant neoplasm of trachea, bronchus and lung: Secondary | ICD-10-CM | POA: Diagnosis not present

## 2024-05-03 DIAGNOSIS — Z85828 Personal history of other malignant neoplasm of skin: Secondary | ICD-10-CM | POA: Insufficient documentation

## 2024-05-03 DIAGNOSIS — R7989 Other specified abnormal findings of blood chemistry: Secondary | ICD-10-CM

## 2024-05-03 LAB — CBC WITH DIFFERENTIAL (CANCER CENTER ONLY)
Abs Immature Granulocytes: 0.01 K/uL (ref 0.00–0.07)
Basophils Absolute: 0 K/uL (ref 0.0–0.1)
Basophils Relative: 0 %
Eosinophils Absolute: 0 K/uL (ref 0.0–0.5)
Eosinophils Relative: 1 %
HCT: 43.8 % (ref 36.0–46.0)
Hemoglobin: 14.3 g/dL (ref 12.0–15.0)
Immature Granulocytes: 0 %
Lymphocytes Relative: 66 %
Lymphs Abs: 3.5 K/uL (ref 0.7–4.0)
MCH: 29.8 pg (ref 26.0–34.0)
MCHC: 32.6 g/dL (ref 30.0–36.0)
MCV: 91.3 fL (ref 80.0–100.0)
Monocytes Absolute: 0.3 K/uL (ref 0.1–1.0)
Monocytes Relative: 5 %
Neutro Abs: 1.5 K/uL — ABNORMAL LOW (ref 1.7–7.7)
Neutrophils Relative %: 28 %
Platelet Count: 69 K/uL — ABNORMAL LOW (ref 150–400)
RBC: 4.8 MIL/uL (ref 3.87–5.11)
RDW: 13.4 % (ref 11.5–15.5)
WBC Count: 5.3 K/uL (ref 4.0–10.5)
nRBC: 0 % (ref 0.0–0.2)

## 2024-05-03 LAB — CMP (CANCER CENTER ONLY)
ALT: 61 U/L — ABNORMAL HIGH (ref 0–44)
AST: 57 U/L — ABNORMAL HIGH (ref 15–41)
Albumin: 3.9 g/dL (ref 3.5–5.0)
Alkaline Phosphatase: 131 U/L — ABNORMAL HIGH (ref 38–126)
Anion gap: 5 (ref 5–15)
BUN: 16 mg/dL (ref 8–23)
CO2: 31 mmol/L (ref 22–32)
Calcium: 9.4 mg/dL (ref 8.9–10.3)
Chloride: 104 mmol/L (ref 98–111)
Creatinine: 0.76 mg/dL (ref 0.44–1.00)
GFR, Estimated: >60 mL/min (ref >60)
Glucose, Bld: 132 mg/dL — ABNORMAL HIGH (ref 70–99)
Potassium: 4.6 mmol/L (ref 3.5–5.1)
Sodium: 140 mmol/L (ref 135–145)
Total Bilirubin: 0.5 mg/dL (ref 0.0–1.2)
Total Protein: 6.8 g/dL (ref 6.5–8.1)

## 2024-05-03 LAB — LACTATE DEHYDROGENASE: LDH: 146 U/L (ref 98–192)

## 2024-05-03 NOTE — Progress Notes (Signed)
 HEMATOLOGY ONCOLOGY PROGRESS NOTE  Date of service: 05/03/2024  Patient Care Team: Caro Harlene POUR, NP as PCP - General (Geriatric Medicine) [name], MD as Consulting Physician (Hematology)  CHIEF COMPLAINT/PURPOSE OF CONSULTATION: Continued evaluation and management of CLL   HISTORY OF PRESENTING ILLNESS: (09/22/2021) Rebecca Aguirre is a wonderful 68 y.o. female who has been referred to us  by Dr Ardeen, Rollene Kass, MD for evaluation and management of possible CLL/SLL   Patient is an overall healthy 68 year old lady with a history of dyslipidemia and osteoporosis and previous history of basal cell carcinoma excised in June 2017. Patient notes that she previously has had history of a cyst in the right axilla in September 2019 for which she was seen by dermatologist and treated with doxycycline with resolution. She also reports having right axillary soft tissue infection in December 1987 which was treated with Duricef. Patient has a history of pulmonary sarcoidosis which was apparently diagnosed in 87 by Dr. Lorella in [place] .  It has been inactive from 2005 to present.  Patient follows with Dr. Darlean at [facility] pulmonology.   Patient has previously had abnormal mammograms about 6 months ago when she was noted to have an enlarged axillary lymph node in the left axilla which was thought to be possibly reactive given she had a Shingrix vaccine in the left arm on 12/18/2020. On repeat mammogram/ultrasound she was noted to have bilateral symmetric axillary lymphadenopathy and therefore a core needle biopsy was recommended   She had a core needle biopsy of her left axillary enlarged lymph node on 08/22/2021 which showed relatively monomorphous proliferation of small lymphoid cells characterized by high nuclear cytoplasmic ratio.  Predominance of B lymphocytes which have CD20, CD79 and CD23 positive as well as have coexpression of CD5.  No significant CD10 or cyclin D1  positivity.  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   Patient notes no fevers no chills no night sweats no unexpected weight loss.  No new skin rashes.  No new fatigue. Has noticed some small lymph nodes in the neck in addition to her axillary lymph nodes. No abdominal pain or distention. No new chest pain or shortness of breath. No recent change in bowel or bladder habits. No new bone pains.   SUMMARY OF ONCOLOGIC HISTORY: Oncology History   No history exists.   INTERVAL HISTORY:  Rebecca Aguirre is a 68 y.o. female who is here today for continued evaluation and management of CLL.   she was last seen by me on 04/03/2024; at the time she mentioned experiencing some weight loss, headaches, oral ulcers, a bit of bruising (2 bruises on her right leg upper thigh)  Today, she says that she has been doing well. Reports that she plans on starting Vitamin-C.   Endorses recent right eye issues (floaters), for which she has seen Ophthalmology who suggested this is a natural progression of aging - saw her 2 weeks ago and again on Monday, now having veil, and was experiencing flashes of light. She will continue to monitor this.   Denies any leg swelling or bleeding issues (nose bleeds, gum bleeds, abnormal/spontaneous bruising). Did see PCP regarding her bruising with knots, which has reportedly improved. Endorses some more gum bleeding on Monday after having her teeth cleaned. States she takes Ferrous Sulfate 5x/ week, and endorses occasional difficulty with bowel movement akin to constipation.  She  notes that her headaches have improved. Has been wearing compression stockings.   She expresses concern about updating her Influenza as she tends to experience fatigue after receiving this. Mother also recently received her Influenza, and the next day was  seen in the ED for mental confusion, fatigue, and unresponsiveness, however other residents experienced similar symptoms.  Regarding her elevated liver enzymes, she was recently started on Allopurinol . Denies abdominal pain or Tylenol use.  She says that she is followed by Dermatology annually, and when she was last there she complained about an ear issues she'd been having, so was prescribed Fluticasone ear drops.  REVIEW OF SYSTEMS:    10 Point review of systems of done and is negative except as noted above.  MEDICAL HISTORY Past Medical History:  Diagnosis Date   Anemia    Arthritis    Cataract surgeries 1/28, 08/17/2023   CLL (chronic lymphocytic leukemia) (HCC)    Hirsutism    Hypercholesteremia    Osteoporosis 10/20/2016   T score -2.8   Thyroid  disease     SURGICAL HISTORY Past Surgical History:  Procedure Laterality Date   BASAL CELL CARCINOMA EXCISION  12/2015   CATARACT EXTRACTION, BILATERAL     08-03-23 & 08-17-23   CESAREAN SECTION  1991   TONSILLECTOMY AND ADENOIDECTOMY N/A 1960    SOCIAL HISTORY Social History   Tobacco Use   Smoking status: Never   Smokeless tobacco: Never  Vaping Use   Vaping status: Never Used  Substance Use Topics   Alcohol use: Not Currently   Drug use: No    Social History   Social History Narrative   Not on file    SOCIAL DRIVERS OF HEALTH SDOH Screenings   Food Insecurity: No Food Insecurity (09/29/2023)  Housing: Low Risk  (09/29/2023)  Transportation Needs: No Transportation Needs (09/29/2023)  Depression (PHQ2-9): Low Risk  (04/10/2024)  Financial Resource Strain: Low Risk  (09/29/2023)  Physical Activity: Insufficiently Active (09/29/2023)  Social Connections: Socially Integrated (09/29/2023)  Stress: No Stress Concern Present (09/29/2023)  Tobacco Use: Low Risk  (04/10/2024)     FAMILY HISTORY Family History  Problem Relation Age of Onset   Osteoporosis Mother    Memory loss Mother    Dementia Mother    Heart  failure Father    Irritable bowel syndrome Brother    Colon cancer Brother    Cancer Paternal Aunt        anal   Lung cancer Paternal Aunt      ALLERGIES: is allergic to crestor [rosuvastatin] and gabapentin.  MEDICATIONS  Current Outpatient Medications  Medication Sig Dispense Refill   acalabrutinib  maleate (CALQUENCE ) 100 MG tablet Take 1 tablet (100 mg total) by mouth 2 (two) times daily. 60 tablet 2   allopurinol  (ZYLOPRIM ) 100 MG tablet Take 1 tablet (100 mg total) by mouth daily. 60 tablet 1   ascorbic acid (VITAMIN C) 500 MG tablet Take 500 mg by mouth daily.     CALCIUM  PO Take 400 mg by mouth 2 (two) times daily. Includes Vit D 1000, K1 100 mcg, and K 2 30 mg)     Ferrous Sulfate (IRON PO) Take 22 mg by mouth as directed. RAW IRON OTC  3 times weekly     Multiple Vitamin (MULTIVITAMIN) capsule Take 1 capsule by mouth daily.     ondansetron  (ZOFRAN ) 8 MG tablet Take 1 tablet (8 mg total) by mouth every 8 (eight) hours as needed for nausea or vomiting. 20 tablet 0  thyroid  (ARMOUR THYROID ) 15 MG tablet Take 1 tablet (15 mg total) by mouth daily. 90 tablet 1   thyroid  (ARMOUR) 30 MG tablet Take 1 tablet (30 mg total) by mouth daily before breakfast. Take with 15 mg tablet 90 tablet 1   Bacillus Coagulans-Inulin (PROBIOTIC-PREBIOTIC) 1-250 BILLION-MG CAPS Take by mouth. (Patient not taking: Reported on 05/03/2024)     Omega-3 Fatty Acids (FISH OIL PO) Take 1,000 mg by mouth daily. Pro-Omega (Patient not taking: Reported on 05/03/2024)     Current Facility-Administered Medications  Medication Dose Route Frequency Provider Last Rate Last Admin   [START ON 09/05/2024] denosumab  (PROLIA ) injection 60 mg  60 mg Subcutaneous Q6 months Eubanks, Jessica K, NP        PHYSICAL EXAMINATION: ECOG PERFORMANCE STATUS: 1 - Symptomatic but completely ambulatory VITALS: Vitals:   05/03/24 0920  BP: 111/63  Pulse: 78  Resp: 20  Temp: 97.7 F (36.5 C)  SpO2: 98%   Filed Weights    05/03/24 0920  Weight: 99 lb 1.6 oz (45 kg)   Body mass index is 20.02 kg/m.  GENERAL: alert, in no acute distress and comfortable SKIN: no acute rashes, no significant lesions EYES: conjunctiva are pink and non-injected, sclera anicteric OROPHARYNX: MMM, no exudates, no oropharyngeal erythema or ulceration NECK: supple, no JVD LYMPH:  Lymphnodes have shrunken, no prominent lymphnodes in the cervical, axillary or inguinal regions LUNGS: clear to auscultation b/l with normal respiratory effort HEART: regular rate & rhythm ABDOMEN:  normoactive bowel sounds , non tender, not distended, no hepatomegaly, (+) discomfort to palpation of liver, (+) splenomegaly improving Extremity: no pedal edema PSYCH: alert & oriented x 3 with fluent speech NEURO: no focal motor/sensory deficits    LABORATORY DATA:   I have reviewed the data as listed     Latest Ref Rng & Units 05/03/2024    8:40 AM 04/03/2024   11:08 AM 02/23/2024    1:36 PM  CBC EXTENDED  WBC 4.0 - 10.5 K/uL 5.3  11.6  7.5   RBC 3.87 - 5.11 MIL/uL 4.80  4.40  4.27   Hemoglobin 12.0 - 15.0 g/dL 85.6  86.4  86.9   HCT 36.0 - 46.0 % 43.8  40.4  38.5   Platelets 150 - 400 K/uL 69  55  82   NEUT# 1.7 - 7.7 K/uL 1.5  1.5  1.4   Lymph# 0.7 - 4.0 K/uL 3.5  9.7  5.6    LDH:  Lab Results  Component Value Date   LDH 146 05/03/2024        Latest Ref Rng & Units 05/03/2024    8:40 AM 04/03/2024   11:08 AM 02/23/2024    1:36 PM  CMP  Glucose 70 - 99 mg/dL 867  898  88   BUN 8 - 23 mg/dL 16  17  14    Creatinine 0.44 - 1.00 mg/dL 9.23  9.26  9.37   Sodium 135 - 145 mmol/L 140  139  139   Potassium 3.5 - 5.1 mmol/L 4.6  4.3  4.4   Chloride 98 - 111 mmol/L 104  103  104   CO2 22 - 32 mmol/L 31  32  32   Calcium  8.9 - 10.3 mg/dL 9.4  9.4  9.1   Total Protein 6.5 - 8.1 g/dL 6.8  6.9  6.5   Total Bilirubin 0.0 - 1.2 mg/dL 0.5  0.7  0.6   Alkaline Phos 38 - 126 U/L 131  111  111  AST 15 - 41 U/L 57  57  55   ALT 0 - 44 U/L 61  52   39     RADIOGRAPHIC STUDIES: I have personally reviewed the radiological images as listed and agreed with the findings in the report. No results found.   ASSESSMENT & PLAN:  68 year old female with a history of previous pulmonary sarcoidosis, dyslipidemia, osteoporosis with   #1  Stage IV CLL/SLL  CLL FISH panel with Mono allelic 13 q. deletion Labs today showed lymphocytosis of 5.6k Clinically patient has a few small cervical lymph nodes and bilateral axillary lymph nodes.   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  PLAN: - Discussed lab results on 05/03/2024 in detail with patient: CBC showed WBC of 5.3K, Hemoglobin of 14.3 increased from 13.5, PLTs of 69K increased from 55K, and Neutrophils of 1.5K has not changed. CMP with AST 57 increased from 55 and ALT 52. Did have elevation 6 months ago.  Borderline liver elevation. - LDH reviewed independently: 146  - There is little concern for tumor lysis at this point, but continue Allopurinol  for 1-2 months until bulk of disease as improved.  Obtain liver US .  - Balancing Calquence , which could be causing some thrombocytopenia but likely more so contributed by her spleen.  Discussed that we could increase to 100 mg BID, but will have to monitor more closely.   Continue once daily for now.   Does take Omega-3, suggested holding currently for to reduce risk of increased bruising due to platelet dysfunction at higher doses.  Monitor optical changes.  - Recommended drinking orange juice as an alternative Vitamin-C source.  - Has not had Vitamin-D checked recently.   Increase Vitamin-D3  If she'd like to increase dietary Calcium , she can try [employer], cheese, yogurt, or cottage cheese; requires 1000-1500 mg daily.   - Encouraged her to update Influenza. Reviewing benefits of vaccinations and reassured her that it is unlikely that mother's symptoms were related to vaccine  - Assured her that Fluticasone is safe  to use for her ear.   FOLLOW-UP  US  abd in 2 weeks RTC with Dr Onesimo with labs in 4 weeks   The total time spent in the appointment was 30 minutes* .  All of the patient's questions were answered and the patient knows to call the clinic with any problems, questions, or concerns.  Emaline Onesimo MD MS AAHIVMS [facility] Hematology/Oncology Physician [facility]  *Total Encounter Time as defined by the Centers for Medicare and Medicaid Services includes, in addition to the face-to-face time of a patient visit (documented in the note above) non-face-to-face time: obtaining and reviewing outside history, ordering and reviewing medications, tests or procedures, care coordination (communications with other health care professionals or caregivers) and documentation in the medical record.  I,Emily Lagle,acting as a neurosurgeon for Emaline Onesimo, MD.,have documented all relevant documentation on the behalf of Emaline Onesimo, MD,as directed by  Emaline Onesimo, MD while in the presence of Emaline Onesimo, MD.  I have reviewed the above documentation for accuracy and completeness, and I agree with the above.  Tadan Shill, MD

## 2024-05-12 ENCOUNTER — Encounter (HOSPITAL_COMMUNITY): Payer: Self-pay

## 2024-05-12 ENCOUNTER — Other Ambulatory Visit: Payer: Self-pay

## 2024-05-16 ENCOUNTER — Other Ambulatory Visit: Payer: Self-pay

## 2024-05-17 ENCOUNTER — Encounter: Payer: Self-pay | Admitting: Hematology

## 2024-05-27 ENCOUNTER — Ambulatory Visit (HOSPITAL_COMMUNITY)
Admission: RE | Admit: 2024-05-27 | Discharge: 2024-05-27 | Disposition: A | Source: Ambulatory Visit | Attending: Hematology

## 2024-05-27 DIAGNOSIS — K828 Other specified diseases of gallbladder: Secondary | ICD-10-CM | POA: Diagnosis not present

## 2024-05-27 DIAGNOSIS — R7989 Other specified abnormal findings of blood chemistry: Secondary | ICD-10-CM | POA: Diagnosis present

## 2024-05-27 DIAGNOSIS — Z8571 Personal history of Hodgkin lymphoma: Secondary | ICD-10-CM | POA: Diagnosis not present

## 2024-05-27 DIAGNOSIS — R162 Hepatomegaly with splenomegaly, not elsewhere classified: Secondary | ICD-10-CM | POA: Diagnosis not present

## 2024-05-29 ENCOUNTER — Inpatient Hospital Stay

## 2024-05-29 ENCOUNTER — Ambulatory Visit: Payer: Self-pay | Admitting: Nurse Practitioner

## 2024-05-29 ENCOUNTER — Inpatient Hospital Stay: Admitting: Hematology

## 2024-05-30 ENCOUNTER — Other Ambulatory Visit: Payer: Self-pay

## 2024-05-30 DIAGNOSIS — C9110 Chronic lymphocytic leukemia of B-cell type not having achieved remission: Secondary | ICD-10-CM

## 2024-05-31 ENCOUNTER — Inpatient Hospital Stay (HOSPITAL_BASED_OUTPATIENT_CLINIC_OR_DEPARTMENT_OTHER): Admitting: Hematology

## 2024-05-31 ENCOUNTER — Inpatient Hospital Stay: Attending: Hematology

## 2024-05-31 VITALS — BP 108/69 | HR 76 | Temp 97.7°F | Resp 20 | Wt 106.6 lb

## 2024-05-31 DIAGNOSIS — C9110 Chronic lymphocytic leukemia of B-cell type not having achieved remission: Secondary | ICD-10-CM

## 2024-05-31 DIAGNOSIS — Z801 Family history of malignant neoplasm of trachea, bronchus and lung: Secondary | ICD-10-CM | POA: Diagnosis not present

## 2024-05-31 DIAGNOSIS — R7989 Other specified abnormal findings of blood chemistry: Secondary | ICD-10-CM | POA: Diagnosis not present

## 2024-05-31 DIAGNOSIS — K819 Cholecystitis, unspecified: Secondary | ICD-10-CM | POA: Diagnosis not present

## 2024-05-31 DIAGNOSIS — Z800 Family history of malignant neoplasm of digestive organs: Secondary | ICD-10-CM | POA: Diagnosis not present

## 2024-05-31 DIAGNOSIS — R161 Splenomegaly, not elsewhere classified: Secondary | ICD-10-CM | POA: Diagnosis not present

## 2024-05-31 DIAGNOSIS — D696 Thrombocytopenia, unspecified: Secondary | ICD-10-CM | POA: Diagnosis not present

## 2024-05-31 LAB — CMP (CANCER CENTER ONLY)
ALT: 81 U/L — ABNORMAL HIGH (ref 0–44)
AST: 72 U/L — ABNORMAL HIGH (ref 15–41)
Albumin: 4 g/dL (ref 3.5–5.0)
Alkaline Phosphatase: 143 U/L — ABNORMAL HIGH (ref 38–126)
Anion gap: 8 (ref 5–15)
BUN: 15 mg/dL (ref 8–23)
CO2: 28 mmol/L (ref 22–32)
Calcium: 9.4 mg/dL (ref 8.9–10.3)
Chloride: 104 mmol/L (ref 98–111)
Creatinine: 0.64 mg/dL (ref 0.44–1.00)
GFR, Estimated: >60 mL/min (ref >60)
Glucose, Bld: 99 mg/dL (ref 70–99)
Potassium: 4.3 mmol/L (ref 3.5–5.1)
Sodium: 139 mmol/L (ref 135–145)
Total Bilirubin: 0.5 mg/dL (ref 0.0–1.2)
Total Protein: 6.6 g/dL (ref 6.5–8.1)

## 2024-05-31 LAB — CBC WITH DIFFERENTIAL (CANCER CENTER ONLY)
Abs Immature Granulocytes: 0.01 K/uL (ref 0.00–0.07)
Basophils Absolute: 0 K/uL (ref 0.0–0.1)
Basophils Relative: 0 %
Eosinophils Absolute: 0 K/uL (ref 0.0–0.5)
Eosinophils Relative: 1 %
HCT: 42.9 % (ref 36.0–46.0)
Hemoglobin: 14.3 g/dL (ref 12.0–15.0)
Immature Granulocytes: 0 %
Lymphocytes Relative: 50 %
Lymphs Abs: 2.1 K/uL (ref 0.7–4.0)
MCH: 30 pg (ref 26.0–34.0)
MCHC: 33.3 g/dL (ref 30.0–36.0)
MCV: 89.9 fL (ref 80.0–100.0)
Monocytes Absolute: 0.3 K/uL (ref 0.1–1.0)
Monocytes Relative: 6 %
Neutro Abs: 1.8 K/uL (ref 1.7–7.7)
Neutrophils Relative %: 43 %
Platelet Count: 64 K/uL — ABNORMAL LOW (ref 150–400)
RBC: 4.77 MIL/uL (ref 3.87–5.11)
RDW: 13.4 % (ref 11.5–15.5)
WBC Count: 4.3 K/uL (ref 4.0–10.5)
nRBC: 0 % (ref 0.0–0.2)

## 2024-05-31 LAB — LACTATE DEHYDROGENASE: LDH: 166 U/L (ref 105–235)

## 2024-05-31 NOTE — Progress Notes (Signed)
 HEMATOLOGY ONCOLOGY PROGRESS NOTE  Date of service: 05/31/2024  Patient Care Team: Rebecca Harlene POUR, NP as PCP - General (Geriatric Medicine) [name], MD as Consulting Physician (Hematology)  CHIEF COMPLAINT/PURPOSE OF CONSULTATION: Follow-up for continued evaluation and management of  CLL     HISTORY OF PRESENTING ILLNESS: (09/22/2021) Rebecca Aguirre is a wonderful 68 y.o. female who has been referred to us  by Dr Ardeen, Rollene Kass, MD for evaluation and management of possible CLL/SLL   Patient is an overall healthy 68 year old lady with a history of dyslipidemia and osteoporosis and previous history of basal cell carcinoma excised in June 2017. Patient notes that she previously has had history of a cyst in the right axilla in September 2019 for which she was seen by dermatologist and treated with doxycycline with resolution. She also reports having right axillary soft tissue infection in December 1987 which was treated with Duricef. Patient has a history of pulmonary sarcoidosis which was apparently diagnosed in 69 by Dr. Lorella in [place] .  It has been inactive from 2005 to present.  Patient follows with Dr. Darlean at [facility] pulmonology.   Patient has previously had abnormal mammograms about 6 months ago when she was noted to have an enlarged axillary lymph node in the left axilla which was thought to be possibly reactive given she had a Shingrix vaccine in the left arm on 12/18/2020. On repeat mammogram/ultrasound she was noted to have bilateral symmetric axillary lymphadenopathy and therefore a core needle biopsy was recommended   She had a core needle biopsy of her left axillary enlarged lymph node on 08/22/2021 which showed relatively monomorphous proliferation of small lymphoid cells characterized by high nuclear cytoplasmic ratio.  Predominance of B lymphocytes which have CD20, CD79 and CD23 positive as well as have coexpression of CD5.  No significant CD10 or  cyclin D1 positivity.  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   Patient notes no fevers no chills no night sweats no unexpected weight loss.  No new skin rashes.  No new fatigue. Has noticed some small lymph nodes in the neck in addition to her axillary lymph nodes. No abdominal pain or distention. No new chest pain or shortness of breath. No recent change in bowel or bladder habits. No new bone pains.   INTERVAL HISTORY: Rebecca Aguirre is a 68 y.o. female who is here today for continued evaluation and management of CLL.    she was last seen by me on 05/03/2024; at the time she mentioned experiencing eye floaters and flashes of light, as well as some gum bleeding during teeth cleaning.  Today, she says that her headaches have improved. On review of her abdominal US , she mentions that she has seen GI previously. Denies pain with eating.  She mentions that she does still need her Influenza vaccine, but will be seeing her PCP on 06/12/2024.  She discusses the possibility of returning to work. She does not feel like she'd be able to do so, but may have to provide her capabilities due to legal discussions she's been having.  Denies any fevers/chills, drenching night sweats, abdominal pain, nausea, bloating, or bleeding issues (nose bleeds, gum bleeds, abnormal/spontaneous bruising).   REVIEW OF SYSTEMS:   10 Point review of systems of done and is negative except as noted above.  MEDICAL HISTORY Past Medical History:  Diagnosis Date   Anemia  Arthritis    Cataract surgeries 1/28, 08/17/2023   CLL (chronic lymphocytic leukemia) (HCC)    Hirsutism    Hypercholesteremia    Osteoporosis 10/20/2016   T score -2.8   Thyroid  disease     SURGICAL HISTORY Past Surgical History:  Procedure Laterality Date   BASAL CELL CARCINOMA EXCISION   12/2015   CATARACT EXTRACTION, BILATERAL     08-03-23 & 08-17-23   CESAREAN SECTION  1991   TONSILLECTOMY AND ADENOIDECTOMY N/A 1960    SOCIAL HISTORY Social History   Tobacco Use   Smoking status: Never   Smokeless tobacco: Never  Vaping Use   Vaping status: Never Used  Substance Use Topics   Alcohol use: Not Currently   Drug use: No    Social History   Social History Narrative   Not on file    SOCIAL DRIVERS OF HEALTH SDOH Screenings   Food Insecurity: No Food Insecurity (09/29/2023)  Housing: Low Risk  (09/29/2023)  Transportation Needs: No Transportation Needs (09/29/2023)  Depression (PHQ2-9): Low Risk  (04/10/2024)  Financial Resource Strain: Low Risk  (09/29/2023)  Physical Activity: Insufficiently Active (09/29/2023)  Social Connections: Socially Integrated (09/29/2023)  Stress: No Stress Concern Present (09/29/2023)  Tobacco Use: Low Risk  (04/10/2024)     FAMILY HISTORY Family History  Problem Relation Age of Onset   Osteoporosis Mother    Memory loss Mother    Dementia Mother    Heart failure Father    Irritable bowel syndrome Brother    Colon cancer Brother    Cancer Paternal Aunt        anal   Lung cancer Paternal Aunt      ALLERGIES: is allergic to crestor [rosuvastatin] and gabapentin.  MEDICATIONS  Current Outpatient Medications  Medication Sig Dispense Refill   acalabrutinib  maleate (CALQUENCE ) 100 MG tablet Take 1 tablet (100 mg total) by mouth 2 (two) times daily. 60 tablet 2   allopurinol  (ZYLOPRIM ) 100 MG tablet Take 1 tablet (100 mg total) by mouth daily. 60 tablet 1   ascorbic acid (VITAMIN C) 500 MG tablet Take 500 mg by mouth daily.     Bacillus Coagulans-Inulin (PROBIOTIC-PREBIOTIC) 1-250 BILLION-MG CAPS Take by mouth. (Patient not taking: Reported on 05/03/2024)     CALCIUM  PO Take 400 mg by mouth 2 (two) times daily. Includes Vit D 1000, K1 100 mcg, and K 2 30 mg)     Ferrous Sulfate (IRON PO) Take 22 mg by mouth as directed. RAW IRON  OTC  3 times weekly     Multiple Vitamin (MULTIVITAMIN) capsule Take 1 capsule by mouth daily.     Omega-3 Fatty Acids (FISH OIL PO) Take 1,000 mg by mouth daily. Pro-Omega (Patient not taking: Reported on 05/03/2024)     ondansetron  (ZOFRAN ) 8 MG tablet Take 1 tablet (8 mg total) by mouth every 8 (eight) hours as needed for nausea or vomiting. 20 tablet 0   thyroid  (ARMOUR THYROID ) 15 MG tablet Take 1 tablet (15 mg total) by mouth daily. 90 tablet 1   thyroid  (ARMOUR) 30 MG tablet Take 1 tablet (30 mg total) by mouth daily before breakfast. Take with 15 mg tablet 90 tablet 1   Current Facility-Administered Medications  Medication Dose Route Frequency Provider Last Rate Last Admin   [START ON 09/05/2024] denosumab  (PROLIA ) injection 60 mg  60 mg Subcutaneous Q6 months Eubanks, Jessica K, NP        PHYSICAL EXAMINATION: ECOG PERFORMANCE STATUS: 1 - Symptomatic but completely ambulatory VITALS:  Vitals:   05/31/24 1157  BP: 108/69  Pulse: 76  Resp: 20  Temp: 97.7 F (36.5 C)  SpO2: 99%   Filed Weights   05/31/24 1157  Weight: 106 lb 9.6 oz (48.4 kg)   Body mass index is 21.53 kg/m.  GENERAL: alert, in no acute distress and comfortable SKIN: no acute rashes, no significant lesions EYES: conjunctiva are pink and non-injected, sclera anicteric OROPHARYNX: MMM, no exudates, no oropharyngeal erythema or ulceration NECK: supple, no JVD LYMPH:  (-) improvement of palpable lymphadenopathy in the cervical, axillary or inguinal regions LUNGS: clear to auscultation b/l with normal respiratory effort HEART: regular rate & rhythm ABDOMEN:  normoactive bowel sounds , (+) tenderness to palpation of gallbladder, not distended, no hepatomegaly, (-) improvement in splenomegaly Extremity: no pedal edema PSYCH: alert & oriented x 3 with fluent speech NEURO: no focal motor/sensory deficits  LABORATORY DATA:   I have reviewed the data as listed     Latest Ref Rng & Units 05/31/2024   11:42  AM 05/03/2024    8:40 AM 04/03/2024   11:08 AM  CBC EXTENDED  WBC 4.0 - 10.5 K/uL 4.3  5.3  11.6   RBC 3.87 - 5.11 MIL/uL 4.77  4.80  4.40   Hemoglobin 12.0 - 15.0 g/dL 85.6  85.6  86.4   HCT 36.0 - 46.0 % 42.9  43.8  40.4   Platelets 150 - 400 K/uL 64  69  55   NEUT# 1.7 - 7.7 K/uL 1.8  1.5  1.5   Lymph# 0.7 - 4.0 K/uL 2.1  3.5  9.7    LDH:  Lab Results  Component Value Date   LDH 166 05/31/2024        Latest Ref Rng & Units 05/31/2024   11:42 AM 05/03/2024    8:40 AM 04/03/2024   11:08 AM  CMP  Glucose 70 - 99 mg/dL 99  867  898   BUN 8 - 23 mg/dL 15  16  17    Creatinine 0.44 - 1.00 mg/dL 9.35  9.23  9.26   Sodium 135 - 145 mmol/L 139  140  139   Potassium 3.5 - 5.1 mmol/L 4.3  4.6  4.3   Chloride 98 - 111 mmol/L 104  104  103   CO2 22 - 32 mmol/L 28  31  32   Calcium  8.9 - 10.3 mg/dL 9.4  9.4  9.4   Total Protein 6.5 - 8.1 g/dL 6.6  6.8  6.9   Total Bilirubin 0.0 - 1.2 mg/dL 0.5  0.5  0.7   Alkaline Phos 38 - 126 U/L 143  131  111   AST 15 - 41 U/L 72  57  57   ALT 0 - 44 U/L 81  61  52      RADIOGRAPHIC STUDIES: I have personally reviewed the radiological images as listed and agreed with the findings in the report. US  Abdomen Complete Result Date: 05/27/2024 CLINICAL DATA:  Elevation for abnormal LFTs and splenomegaly. History of CLL EXAM: ABDOMEN ULTRASOUND COMPLETE COMPARISON:  September 28, 2022, April 23rd 2025 FINDINGS: Gallbladder: Mild wall thickening of the gallbladder. No cholelithiasis. Small rounded area along the gallbladder wall measuring up to 5 mm, likely a tiny polyp. No sonographic Murphy sign noted by sonographer. Common bile duct: Diameter: Visualized portion measures 2 mm, within normal limits. Liver: Echogenic mass along the posterior RIGHT liver, previously characterized on prior CT and consistent with a benign hemangioma. Moderately heterogeneous parenchymal echogenicity.  Portal vein is patent on color Doppler imaging with normal direction of blood  flow towards the liver. IVC: No abnormality visualized. Pancreas: Visualized portion unremarkable. Spleen: Splenomegaly spanning at least 16.2 x 15.0 x 4.6 cm for a volume of at least 590 ML. Right Kidney: Length: 9.6 cm. Echogenicity within normal limits. No mass or hydronephrosis visualized. Small extrarenal pelvis. Left Kidney: Length: 10.7 cm. Echogenicity within normal limits. No mass or hydronephrosis visualized. Abdominal aorta: No aneurysm visualized. Other findings: None. IMPRESSION: 1. Splenomegaly. 2. Mild wall thickening of the gallbladder without cholelithiasis. This is nonspecific and could be reactive in the setting of fluid overload versus reflect sequela of chronic cholecystitis. 3. Moderately heterogeneous hepatic parenchymal echogenicity. This is nonspecific but can be seen in the setting of hepatic steatosis. Electronically Signed   By: Corean Salter M.D.   On: 05/27/2024 15:57    ASSESSMENT & PLAN:  68 y.o. 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  PLAN: - Discussed lab results on 05/31/2024 in detail with patient: CBC showed WBC of 4.3K, Hemoglobin of 14.3, and PLTs of 64K decreased from 69K. CMP with Alkaline Phos 143 increased from 131, ALT 81 increased from 61, and AST 72 increased from 57.  LDH 166. -continue calquence  100mg  po BID - Reviewed 05/27/2024 Abdomen US  with patient:  1. Splenomegaly.    This has improved, noted on physical exam. 2. Mild wall thickening of the gallbladder without cholelithiasis. This is nonspecific and could be reactive in the setting of fluid overload versus reflect sequela of chronic cholecystitis.   Discussed with  patient. She is asymptomatic, though on exam there was tenderness to palpation of gallbladder.  Ordering HIDA scan to be done in 1 week. 3. Moderately heterogeneous hepatic parenchymal echogenicity. This is nonspecific but can be seen in the setting of hepatic steatosis.    Correlates with elevated LFTs on labs.   Reviewed etiologies for fatty liver. She does note history of untreated hypothyroidism.    Discussed following-up with GI and PCP regarding this.  FOLLOW-UP  HIDA scan with EF in 1 week Phone visit with dr Onesimo in 2 weeks  The total time spent in the appointment was 30 minutes* .  All of the patient's questions were answered and the patient knows to call the clinic with any problems, questions, or concerns.  Emaline Onesimo MD MS AAHIVMS [facility] Hematology/Oncology Physician [facility]  *Total Encounter Time as defined by the Centers for Medicare and Medicaid Services includes, in addition to the face-to-face time of a patient visit (documented in the note above) non-face-to-face time: obtaining and reviewing outside history, ordering and reviewing medications, tests or procedures, care coordination (communications with other health care professionals or caregivers) and documentation in the medical record.  I,Emily Lagle,acting as a neurosurgeon for Emaline Onesimo, MD.,have documented all relevant documentation on the behalf of Emaline Onesimo, MD,as directed by  Emaline Onesimo, MD while in the presence of Emaline Onesimo, MD.  I have reviewed the above documentation for accuracy and completeness, and I agree with the above.  Cristianna Cyr, MD

## 2024-06-02 ENCOUNTER — Other Ambulatory Visit (HOSPITAL_COMMUNITY): Payer: Self-pay

## 2024-06-06 ENCOUNTER — Other Ambulatory Visit (HOSPITAL_COMMUNITY): Payer: Self-pay

## 2024-06-08 ENCOUNTER — Other Ambulatory Visit: Payer: Self-pay

## 2024-06-08 ENCOUNTER — Other Ambulatory Visit (HOSPITAL_COMMUNITY): Payer: Self-pay

## 2024-06-08 NOTE — Progress Notes (Signed)
 Specialty Pharmacy Refill Coordination Note  Rebecca Aguirre is a 68 y.o. female contacted today regarding refills of specialty medication(s) Acalabrutinib  Maleate (CALQUENCE )   Patient requested [name] at [facility] [place] date: 06/16/24   Medication will be filled on: 06/15/24

## 2024-06-09 ENCOUNTER — Other Ambulatory Visit: Payer: Self-pay

## 2024-06-10 DIAGNOSIS — H43811 Vitreous degeneration, right eye: Secondary | ICD-10-CM | POA: Diagnosis not present

## 2024-06-12 ENCOUNTER — Other Ambulatory Visit: Payer: Self-pay

## 2024-06-12 ENCOUNTER — Ambulatory Visit: Admitting: Nurse Practitioner

## 2024-06-12 ENCOUNTER — Encounter: Payer: Self-pay | Admitting: Nurse Practitioner

## 2024-06-12 VITALS — BP 118/74 | HR 78 | Temp 97.5°F | Ht 59.5 in | Wt 100.4 lb

## 2024-06-12 DIAGNOSIS — Z0000 Encounter for general adult medical examination without abnormal findings: Secondary | ICD-10-CM | POA: Diagnosis not present

## 2024-06-12 NOTE — Progress Notes (Signed)
 Specialty Pharmacy Ongoing Clinical Assessment Note  Rebecca Aguirre is a 68 y.o. female who is being followed by the specialty pharmacy service for RxSp Oncology   Patient's specialty medication(s) reviewed today: Acalabrutinib  Maleate (CALQUENCE )   Missed doses in the last 4 weeks: 0   Patient/Caregiver did not have any additional questions or concerns.   Therapeutic benefit summary: Patient is achieving benefit   Adverse events/side effects summary: Experienced adverse events/side effects   Patient's therapy is appropriate to: Continue    Goals Addressed             This Visit's Progress    Maintain optimal adherence to therapy   On track    Patient is on track. Patient will maintain adherence          Follow up: 12 months  Tosca Pletz M Lailah Marcelli Specialty Pharmacist

## 2024-06-12 NOTE — Patient Instructions (Signed)
 Rebecca Aguirre,  Thank you for taking the time for your Medicare Wellness Visit. I appreciate your continued commitment to your health goals. Please review the care plan we discussed, and feel free to reach out if I can assist you further.  Please note that Annual Wellness Visits do not include a physical exam. Some assessments may be limited, especially if the visit was conducted virtually. If needed, we may recommend an in-person follow-up with your provider.  Ongoing Care Seeing your primary care provider every 3 to 6 months helps us  monitor your health and provide consistent, personalized care.   Referrals If a referral was made during today's visit and you haven't received any updates within two weeks, please contact the referred provider directly to check on the status.  Recommended Screenings:  Health Maintenance  Topic Date Due   Flu Shot  10/03/2024*   COVID-19 Vaccine (4 - 2025-26 season) 07/06/2025*   Medicare Annual Wellness Visit  06/12/2025   Breast Cancer Screening  02/07/2026   DTaP/Tdap/Td vaccine (4 - Td or Tdap) 06/20/2031   Colon Cancer Screening  05/05/2033   Pneumococcal Vaccine for age over 46  Completed   Osteoporosis screening with Bone Density Scan  Completed   Hepatitis C Screening  Completed   Zoster (Shingles) Vaccine  Completed   Meningitis B Vaccine  Aged Out  *Topic was postponed. The date shown is not the original due date.       06/12/2024    1:11 PM  Advanced Directives  Does Patient Have a Medical Advance Directive? Yes  Type of Estate Agent of [place];Living will  Does patient want to make changes to medical advance directive? Yes (MAU/Ambulatory/Procedural Areas - Information given)  Copy of Healthcare Power of Attorney in Chart? Yes - validated most recent copy scanned in chart (See row information)    Vision: Annual vision screenings are recommended for early detection of glaucoma, cataracts, and diabetic retinopathy.  These exams can also reveal signs of chronic conditions such as diabetes and high blood pressure.  Dental: Annual dental screenings help detect early signs of oral cancer, gum disease, and other conditions linked to overall health, including heart disease and diabetes.  Please see the attached documents for additional preventive care recommendations

## 2024-06-12 NOTE — Progress Notes (Signed)
 Chief Complaint  Patient presents with   Medicare Wellness    Annual wellness visit. [facility] verified, discussed need for additional covid boosters (declined). Discuss if ok to get flu today, patient is planning to get nuclear scan Friday.      Subjective:   Rebecca Aguirre is a 68 y.o. female who presents for a Medicare Annual Wellness Visit.  Visit info / Clinical Intake: Medicare Wellness Visit Type:: Subsequent Annual Wellness Visit Persons participating in visit and providing information:: patient Medicare Wellness Visit Mode:: In-person (required for WTM) Interpreter Needed?: No Pre-visit prep was completed: no AWV questionnaire completed by patient prior to visit?: yes Living arrangements:: (!) lives alone Patient's Overall Health Status Rating: (!) fair Typical amount of pain: some Does pain affect daily life?: (!) yes (some days) Are you currently prescribed opioids?: no  Dietary Habits and Nutritional Risks How many meals a day?: 2 Eats fruit and vegetables daily?: yes Most meals are obtained by: preparing own meals In the last 2 weeks, have you had any of the following?: none Diabetic:: no  Functional Status Activities of Daily Living (to include ambulation/medication): Independent Ambulation: Independent Medication Administration: Independent Home Management (perform basic housework or laundry): Independent Manage your own finances?: yes Primary transportation is: driving Concerns about vision?: no *vision screening is required for WTM* Concerns about hearing?: no  Fall Screening Falls in the past year?: 0 Number of falls in past year: 0 Was there an injury with Fall?: 0 Fall Risk Category Calculator: 0 Patient Fall Risk Level: Low Fall Risk  Fall Risk Patient at Risk for Falls Due to: No Fall Risks Fall risk Follow up: Falls evaluation completed  Home and Transportation Safety: All rugs have non-skid backing?: N/A, no rugs All stairs or steps have  railings?: yes Grab bars in the bathtub or shower?: yes Have non-skid surface in bathtub or shower?: yes Good home lighting?: yes Regular seat belt use?: yes Hospital stays in the last year:: no  Cognitive Assessment Difficulty concentrating, remembering, or making decisions? : no Will 6CIT or Mini Cog be Completed: yes What year is it?: 0 points What month is it?: 0 points Give patient an address phrase to remember (5 components): [address], [place]  About what time is it?: 0 points Count backwards from 20 to 1: 0 points Say the months of the year in reverse: 0 points Repeat the address phrase from earlier: 0 points 6 CIT Score: 0 points  Advance Directives (For Healthcare) Does Patient Have a Medical Advance Directive?: Yes Does patient want to make changes to medical advance directive?: Yes (MAU/Ambulatory/Procedural Areas - Information given) Type of Advance Directive: Healthcare Power of [place]; Living will Copy of Healthcare Power of Attorney in Chart?: Yes - validated most recent copy scanned in chart (See row information) Copy of Living Will in Chart?: Yes - validated most recent copy scanned in chart (See row information)  Reviewed/Updated  Reviewed/Updated: Reviewed All (Medical, Surgical, Family, Medications, Allergies, Care Teams, Patient Goals)    Allergies (verified) Chlorpheniramine, Crestor [rosuvastatin], Guaifenesin, and Gabapentin   Current Medications (verified) Outpatient Encounter Medications as of 06/12/2024  Medication Sig   acalabrutinib  maleate (CALQUENCE ) 100 MG tablet Take 1 tablet (100 mg total) by mouth 2 (two) times daily.   allopurinol  (ZYLOPRIM ) 100 MG tablet Take 1 tablet (100 mg total) by mouth daily.   ascorbic acid (VITAMIN C) 500 MG tablet Take 500 mg by mouth daily.   CALCIUM  PO Take 400 mg by mouth 2 (  two) times daily. Includes Vit D 1000, K1 100 mcg, and K 2 30 mg)   Ferrous Sulfate (IRON PO) Take 22 mg by mouth as  directed. RAW IRON OTC  5 times weekly   Multiple Vitamin (MULTIVITAMIN) capsule Take 1 capsule by mouth daily.   Omega-3 Fatty Acids (FISH OIL PO) Take 1,000 mg by mouth daily. Pro-Omega   ondansetron  (ZOFRAN ) 8 MG tablet Take 1 tablet (8 mg total) by mouth every 8 (eight) hours as needed for nausea or vomiting.   thyroid  (ARMOUR THYROID ) 15 MG tablet Take 1 tablet (15 mg total) by mouth daily.   thyroid  (ARMOUR) 30 MG tablet Take 1 tablet (30 mg total) by mouth daily before breakfast. Take with 15 mg tablet   [DISCONTINUED] Bacillus Coagulans-Inulin (PROBIOTIC-PREBIOTIC) 1-250 BILLION-MG CAPS Take by mouth. (Patient not taking: Reported on 06/12/2024)   Facility-Administered Encounter Medications as of 06/12/2024  Medication   [START ON 09/05/2024] denosumab  (PROLIA ) injection 60 mg    History: Past Medical History:  Diagnosis Date   Anemia    Arthritis    Cataract surgeries 1/28, 08/17/2023   CLL (chronic lymphocytic leukemia) (HCC)    Hirsutism    Hypercholesteremia    Osteoporosis 10/20/2016   T score -2.8   Thyroid  disease    Past Surgical History:  Procedure Laterality Date   BASAL CELL CARCINOMA EXCISION  12/2015   CATARACT EXTRACTION, BILATERAL     08-03-23 & 08-17-23   CESAREAN SECTION  1991   TONSILLECTOMY AND ADENOIDECTOMY N/A 1960   Family History  Problem Relation Age of Onset   Osteoporosis Mother    Memory loss Mother    Dementia Mother    Heart failure Father    Irritable bowel syndrome Brother    Colon cancer Brother    Bladder Cancer Maternal Grandmother        1 kidney   Cancer Paternal Aunt        anal   Lung cancer Paternal Aunt    Social History   Occupational History   Not on file  Tobacco Use   Smoking status: Never   Smokeless tobacco: Never  Vaping Use   Vaping status: Never Used  Substance and Sexual Activity   Alcohol use: Not Currently   Drug use: No   Sexual activity: Not Currently    Partners: Male    Birth control/protection:  Post-menopausal    Comment: intercourse age 52, less than 5 sexual partners,des neg   Tobacco Counseling Counseling given: Not Answered  SDOH Screenings   Food Insecurity: No Food Insecurity (09/29/2023)  Housing: Low Risk  (09/29/2023)  Transportation Needs: No Transportation Needs (09/29/2023)  Depression (PHQ2-9): Low Risk  (06/12/2024)  Financial Resource Strain: Low Risk  (09/29/2023)  Physical Activity: Insufficiently Active (06/12/2024)  Social Connections: Moderately Integrated (06/12/2024)  Stress: No Stress Concern Present (09/29/2023)  Tobacco Use: Low Risk  (06/12/2024)   See flowsheets for full screening details  Depression Screen PHQ 2 & 9 Depression Scale- Over the past 2 weeks, how often have you been bothered by any of the following problems? Little interest or pleasure in doing things: 0 Feeling down, depressed, or hopeless (PHQ Adolescent also includes...irritable): 0 PHQ-2 Total Score: 0 Trouble falling or staying asleep, or sleeping too much: 1 Feeling tired or having little energy: 1 Poor appetite or overeating (PHQ Adolescent also includes...weight loss): 0 Feeling bad about yourself - or that you are a failure or have let yourself or your family down: 0  Trouble concentrating on things, such as reading the newspaper or watching television [facility] Adolescent also includes...like school work): 1 Moving or speaking so slowly that other people could have noticed. Or the opposite - being so fidgety or restless that you have been moving around a lot more than usual: 0 Thoughts that you would be better off dead, or of hurting yourself in some way: 0 PHQ-9 Total Score: 3 If you checked off any problems, how difficult have these problems made it for you to do your work, take care of things at home, or get along with other people?: Somewhat difficult     Goals Addressed   None          Objective:    Today's Vitals   06/12/24 1318  BP: 118/74  Pulse: 78  Temp: (!)  97.5 F (36.4 C)  SpO2: 98%  Weight: 100 lb 6.4 oz (45.5 kg)  Height: 4' 11.5 (1.511 m)   Body mass index is 19.94 kg/m.  Hearing/Vision screen Hearing Screening - Comments:: No hearing issues  Vision Screening - Comments:: Last eye exam less than 12 months ago with Dr[facility])  Immunizations and Health Maintenance Health Maintenance  Topic Date Due   Influenza Vaccine  10/03/2024 (Originally 02/04/2024)   COVID-19 Vaccine (4 - 2025-26 season) 07/06/2025 (Originally 03/06/2024)   Medicare Annual Wellness (AWV)  06/12/2025   Mammogram  02/07/2026   DTaP/Tdap/Td (4 - Td or Tdap) 06/20/2031   Colonoscopy  05/05/2033   Pneumococcal Vaccine: 50+ Years  Completed   Bone Density Scan  Completed   Hepatitis C Screening  Completed   Zoster Vaccines- Shingrix  Completed   Meningococcal B Vaccine  Aged Out        Assessment/Plan:  This is a routine wellness examination for [facility].  Patient Care Team: Caro Harlene POUR, NP as PCP - General (Geriatric Medicine) [name], MD as Consulting Physician (Hematology) Fleeta Zerita DASEN, MD as Consulting Physician (Ophthalmology)  I have personally reviewed and noted the following in the patient's chart:   Medical and social history Use of alcohol, tobacco or illicit drugs  Current medications and supplements including opioid prescriptions. Functional ability and status Nutritional status Physical activity Advanced directives List of other physicians Hospitalizations, surgeries, and ER visits in previous 12 months Vitals Screenings to include cognitive, depression, and falls Referrals and appointments  No orders of the defined types were placed in this encounter.  In addition, I have reviewed and discussed with patient certain preventive protocols, quality metrics, and best practice recommendations. A written personalized care plan for preventive services as well as general preventive health recommendations were  provided to patient.   Harlene POUR Caro, NP   06/12/2024   Return in 1 year (on 06/12/2025).  After Visit Summary: (In Person-Printed) AVS printed and given to the patient

## 2024-06-13 ENCOUNTER — Ambulatory Visit

## 2024-06-14 ENCOUNTER — Other Ambulatory Visit: Payer: Self-pay

## 2024-06-16 ENCOUNTER — Encounter (HOSPITAL_COMMUNITY)
Admission: RE | Admit: 2024-06-16 | Discharge: 2024-06-16 | Disposition: A | Source: Ambulatory Visit | Attending: Hematology

## 2024-06-16 DIAGNOSIS — K819 Cholecystitis, unspecified: Secondary | ICD-10-CM | POA: Diagnosis present

## 2024-06-16 DIAGNOSIS — K835 Biliary cyst: Secondary | ICD-10-CM | POA: Diagnosis not present

## 2024-06-16 MED ORDER — TECHNETIUM TC 99M MEBROFENIN IV KIT
5.4400 | PACK | Freq: Once | INTRAVENOUS | Status: AC
  Administered 2024-06-16: 5.44 via INTRAVENOUS

## 2024-06-19 ENCOUNTER — Ambulatory Visit

## 2024-06-19 ENCOUNTER — Ambulatory Visit (HOSPITAL_BASED_OUTPATIENT_CLINIC_OR_DEPARTMENT_OTHER)
Admission: RE | Admit: 2024-06-19 | Discharge: 2024-06-19 | Disposition: A | Discharge status: Home / Self Care | Source: Ambulatory Visit | Attending: Obstetrics and Gynecology | Admitting: Obstetrics and Gynecology

## 2024-06-19 DIAGNOSIS — Z23 Encounter for immunization: Secondary | ICD-10-CM | POA: Diagnosis not present

## 2024-06-19 DIAGNOSIS — Z202 Contact with and (suspected) exposure to infections with a predominantly sexual mode of transmission: Secondary | ICD-10-CM

## 2024-06-19 DIAGNOSIS — Z9189 Other specified personal risk factors, not elsewhere classified: Secondary | ICD-10-CM

## 2024-06-19 DIAGNOSIS — M816 Localized osteoporosis [Lequesne]: Secondary | ICD-10-CM

## 2024-06-19 NOTE — Progress Notes (Signed)
 Patient is in office today for a nurse visit for Immunization. Patient Injection was given in the  Left deltoid. Patient tolerated injection well.

## 2024-06-23 ENCOUNTER — Inpatient Hospital Stay: Attending: Hematology | Admitting: Hematology

## 2024-06-23 DIAGNOSIS — D696 Thrombocytopenia, unspecified: Secondary | ICD-10-CM | POA: Insufficient documentation

## 2024-06-23 DIAGNOSIS — C9110 Chronic lymphocytic leukemia of B-cell type not having achieved remission: Secondary | ICD-10-CM | POA: Insufficient documentation

## 2024-06-23 DIAGNOSIS — K760 Fatty (change of) liver, not elsewhere classified: Secondary | ICD-10-CM | POA: Insufficient documentation

## 2024-06-23 DIAGNOSIS — R7989 Other specified abnormal findings of blood chemistry: Secondary | ICD-10-CM | POA: Diagnosis not present

## 2024-06-23 NOTE — Progress Notes (Signed)
 " HEMATOLOGY ONCOLOGY PROGRESS NOTE  Date of service: 06/23/2024  Patient Care Team: Caro Harlene POUR, NP as PCP - General (Geriatric Medicine) [name], MD as Consulting Physician (Hematology) Fleeta Zerita DASEN, MD as Consulting Physician (Ophthalmology)  CHIEF COMPLAINT/PURPOSE OF CONSULTATION: Follow-up for continued evaluation and management of CLL.  HISTORY OF PRESENTING ILLNESS: (09/22/2021) Rebecca Aguirre is a wonderful 68 y.o. female who has been referred to us  by Dr Ardeen, Rollene Kass, MD for evaluation and management of possible CLL/SLL   Patient is an overall healthy 68 year old lady with a history of dyslipidemia and osteoporosis and previous history of basal cell carcinoma excised in June 2017. Patient notes that she previously has had history of a cyst in the right axilla in September 2019 for which she was seen by dermatologist and treated with doxycycline with resolution. She also reports having right axillary soft tissue infection in December 1987 which was treated with Duricef. Patient has a history of pulmonary sarcoidosis which was apparently diagnosed in 27 by Dr. Lorella in [place] .  It has been inactive from 2005 to present.  Patient follows with Dr. Darlean at [facility] pulmonology.   Patient has previously had abnormal mammograms about 6 months ago when she was noted to have an enlarged axillary lymph node in the left axilla which was thought to be possibly reactive given she had a Shingrix vaccine in the left arm on 12/18/2020. On repeat mammogram/ultrasound she was noted to have bilateral symmetric axillary lymphadenopathy and therefore a core needle biopsy was recommended   She had a core needle biopsy of her left axillary enlarged lymph node on 08/22/2021 which showed relatively monomorphous proliferation of small lymphoid cells characterized by high nuclear cytoplasmic ratio.  Predominance of B lymphocytes which have CD20, CD79 and CD23 positive as  well as have coexpression of CD5.  No significant CD10 or cyclin D1 positivity.  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   Patient notes no fevers no chills no night sweats no unexpected weight loss.  No new skin rashes.  No new fatigue. Has noticed some small lymph nodes in the neck in addition to her axillary lymph nodes. No abdominal pain or distention. No new chest pain or shortness of breath. No recent change in bowel or bladder habits. No new bone pains.    SUMMARY OF ONCOLOGIC HISTORY: Oncology History   No problem history exists.    INTERVAL HISTORY: I connected with Rollene Counts on 06/23/2024 at  8:40 AM EST by telephone and verified that I am speaking with the correct person using two identifiers.   I discussed the limitations, risks, security and privacy concerns of performing an evaluation and management service by telemedicine and the availability of in-person appointments. I also discussed with the patient that there may be a patient responsible charge related to this service. The patient expressed understanding and agreed to proceed.   Other persons participating in the visit and their role in the encounter: Medical Scribe, Alan Blowers.  Patients location: Home Providers location: [facility]   Chief Complaint: continued evaluation and management of CLL., discussion of HIDA scan.   Patient notes no acute new symptoms since recent clinic visit. She has a HIDA scan to evaluate findings on recent US  abd done for abnormal LFts . Results were discussed in details.  REVIEW OF SYSTEMS:   10 Point review of systems  of done and is negative except as noted above.  MEDICAL HISTORY Past Medical History:  Diagnosis Date   Anemia    Arthritis    Cataract surgeries 1/28, 08/17/2023   CLL (chronic lymphocytic leukemia) (HCC)     Hirsutism    Hypercholesteremia    Osteoporosis 10/20/2016   T score -2.8   Thyroid  disease     SURGICAL HISTORY Past Surgical History:  Procedure Laterality Date   BASAL CELL CARCINOMA EXCISION  12/2015   CATARACT EXTRACTION, BILATERAL     08-03-23 & 08-17-23   CESAREAN SECTION  1991   TONSILLECTOMY AND ADENOIDECTOMY N/A 1960    SOCIAL HISTORY Social History[1]  Social History   Social History Narrative   Not on file    SOCIAL DRIVERS OF HEALTH SDOH Screenings   Food Insecurity: No Food Insecurity (09/29/2023)  Housing: Low Risk (09/29/2023)  Transportation Needs: No Transportation Needs (09/29/2023)  Depression (PHQ2-9): Low Risk (06/12/2024)  Financial Resource Strain: Low Risk (09/29/2023)  Physical Activity: Insufficiently Active (06/12/2024)  Social Connections: Moderately Integrated (06/12/2024)  Stress: Stress Concern Present (06/12/2024)  Tobacco Use: Low Risk (06/12/2024)     FAMILY HISTORY Family History  Problem Relation Age of Onset   Osteoporosis Mother    Memory loss Mother    Dementia Mother    Heart failure Father    Irritable bowel syndrome Brother    Colon cancer Brother    Bladder Cancer Maternal Grandmother        1 kidney   Cancer Paternal Aunt        anal   Lung cancer Paternal Aunt      ALLERGIES: is allergic to chlorpheniramine, crestor [rosuvastatin], guaifenesin, and gabapentin.  MEDICATIONS  Current Outpatient Medications  Medication Sig Dispense Refill   acalabrutinib  maleate (CALQUENCE ) 100 MG tablet Take 1 tablet (100 mg total) by mouth 2 (two) times daily. 60 tablet 2   allopurinol  (ZYLOPRIM ) 100 MG tablet Take 1 tablet (100 mg total) by mouth daily. 60 tablet 1   ascorbic acid (VITAMIN C) 500 MG tablet Take 500 mg by mouth daily.     CALCIUM  PO Take 400 mg by mouth 2 (two) times daily. Includes Vit D 1000, K1 100 mcg, and K 2 30 mg)     Ferrous Sulfate (IRON PO) Take 22 mg by mouth as directed. RAW IRON OTC  5 times weekly      Multiple Vitamin (MULTIVITAMIN) capsule Take 1 capsule by mouth daily.     Omega-3 Fatty Acids (FISH OIL PO) Take 1,000 mg by mouth daily. Pro-Omega     ondansetron  (ZOFRAN ) 8 MG tablet Take 1 tablet (8 mg total) by mouth every 8 (eight) hours as needed for nausea or vomiting. 20 tablet 0   thyroid  (ARMOUR THYROID ) 15 MG tablet Take 1 tablet (15 mg total) by mouth daily. 90 tablet 1   thyroid  (ARMOUR) 30 MG tablet Take 1 tablet (30 mg total) by mouth daily before breakfast. Take with 15 mg tablet 90 tablet 1   Current Facility-Administered Medications  Medication Dose Route Frequency Provider Last Rate Last Admin   [START ON 09/05/2024] denosumab  (PROLIA ) injection 60 mg  60 mg Subcutaneous Q6 months Eubanks, Jessica K, NP        PHYSICAL EXAMINATION TELEPHONE VISIT:   LABORATORY DATA:   I have reviewed the data as listed     Latest Ref Rng & Units 05/31/2024   11:42 AM 05/03/2024    8:40 AM 04/03/2024   11:08  AM  CBC EXTENDED  WBC 4.0 - 10.5 K/uL 4.3  5.3  11.6   RBC 3.87 - 5.11 MIL/uL 4.77  4.80  4.40   Hemoglobin 12.0 - 15.0 g/dL 85.6  85.6  86.4   HCT 36.0 - 46.0 % 42.9  43.8  40.4   Platelets 150 - 400 K/uL 64  69  55   NEUT# 1.7 - 7.7 K/uL 1.8  1.5  1.5   Lymph# 0.7 - 4.0 K/uL 2.1  3.5  9.7        Latest Ref Rng & Units 05/31/2024   11:42 AM 05/03/2024    8:40 AM 04/03/2024   11:08 AM  CMP  Glucose 70 - 99 mg/dL 99  867  898   BUN 8 - 23 mg/dL 15  16  17    Creatinine 0.44 - 1.00 mg/dL 9.35  9.23  9.26   Sodium 135 - 145 mmol/L 139  140  139   Potassium 3.5 - 5.1 mmol/L 4.3  4.6  4.3   Chloride 98 - 111 mmol/L 104  104  103   CO2 22 - 32 mmol/L 28  31  32   Calcium  8.9 - 10.3 mg/dL 9.4  9.4  9.4   Total Protein 6.5 - 8.1 g/dL 6.6  6.8  6.9   Total Bilirubin 0.0 - 1.2 mg/dL 0.5  0.5  0.7   Alkaline Phos 38 - 126 U/L 143  131  111   AST 15 - 41 U/L 72  57  57   ALT 0 - 44 U/L 81  61  52    CLINICAL DATA: Concern for cholecystitis further  evaluation.  EXAM: NUCLEAR MEDICINE HEPATOBILIARY IMAGING WITH GALLBLADDER EF  TECHNIQUE: Sequential images of the abdomen were obtained out to 60 minutes following intravenous administration of radiopharmaceutical. After oral ingestion of Ensure, gallbladder ejection fraction was determined. At 60 min, normal ejection fraction is greater than 33%.  RADIOPHARMACEUTICALS: 5.44 mCi Tc-68m Choletec  IV  COMPARISON: Ultrasound May 27, 2024  FINDINGS: Prompt uptake and biliary excretion of activity by the liver is seen. Gallbladder activity is visualized, consistent with patency of cystic duct. Biliary activity passes into small bowel, consistent with patent common bile duct.  Calculated gallbladder ejection fraction is 64%. (Normal gallbladder ejection fraction with Ensure is greater than 33% and less than 80%.)  IMPRESSION: 1. Patent cystic and common bile ducts.  2. Normal gallbladder ejection fraction.   Electronically Signed By: Reyes Holder M.D. On: 06/19/2024 17:21    RADIOGRAPHIC STUDIES: I have personally reviewed the radiological images as listed and agreed with the findings in the report. NM Hepato W/EF Result Date: 06/19/2024 CLINICAL DATA:  Concern for cholecystitis further evaluation. EXAM: NUCLEAR MEDICINE HEPATOBILIARY IMAGING WITH GALLBLADDER EF TECHNIQUE: Sequential images of the abdomen were obtained out to 60 minutes following intravenous administration of radiopharmaceutical. After oral ingestion of Ensure, gallbladder ejection fraction was determined. At 60 min, normal ejection fraction is greater than 33%. RADIOPHARMACEUTICALS:  5.44 mCi Tc-59m  Choletec  IV COMPARISON:  Ultrasound May 27, 2024 FINDINGS: Prompt uptake and biliary excretion of activity by the liver is seen. Gallbladder activity is visualized, consistent with patency of cystic duct. Biliary activity passes into small bowel, consistent with patent common bile duct. Calculated  gallbladder ejection fraction is 64%. (Normal gallbladder ejection fraction with Ensure is greater than 33% and less than 80%.) IMPRESSION: 1.  Patent cystic and common bile ducts. 2.  Normal gallbladder ejection fraction. Electronically Signed   By:  Reyes Holder M.D.   On: 06/19/2024 17:21   US  Abdomen Complete Result Date: 05/27/2024 CLINICAL DATA:  Elevation for abnormal LFTs and splenomegaly. History of CLL EXAM: ABDOMEN ULTRASOUND COMPLETE COMPARISON:  September 28, 2022, April 23rd 2025 FINDINGS: Gallbladder: Mild wall thickening of the gallbladder. No cholelithiasis. Small rounded area along the gallbladder wall measuring up to 5 mm, likely a tiny polyp. No sonographic Murphy sign noted by sonographer. Common bile duct: Diameter: Visualized portion measures 2 mm, within normal limits. Liver: Echogenic mass along the posterior RIGHT liver, previously characterized on prior CT and consistent with a benign hemangioma. Moderately heterogeneous parenchymal echogenicity. Portal vein is patent on color Doppler imaging with normal direction of blood flow towards the liver. IVC: No abnormality visualized. Pancreas: Visualized portion unremarkable. Spleen: Splenomegaly spanning at least 16.2 x 15.0 x 4.6 cm for a volume of at least 590 ML. Right Kidney: Length: 9.6 cm. Echogenicity within normal limits. No mass or hydronephrosis visualized. Small extrarenal pelvis. Left Kidney: Length: 10.7 cm. Echogenicity within normal limits. No mass or hydronephrosis visualized. Abdominal aorta: No aneurysm visualized. Other findings: None. IMPRESSION: 1. Splenomegaly. 2. Mild wall thickening of the gallbladder without cholelithiasis. This is nonspecific and could be reactive in the setting of fluid overload versus reflect sequela of chronic cholecystitis. 3. Moderately heterogeneous hepatic parenchymal echogenicity. This is nonspecific but can be seen in the setting of hepatic steatosis. Electronically Signed   By: Corean Salter M.D.   On: 05/27/2024 15:57    ASSESSMENT & PLAN:  68 y.o. 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   #2  Thrombocytopenia platelets of 55k due to CLL and Calquence .,spelnoemgaly  #3 Abnormal LFTs -- fatty liver like changes on US  Abd. Mild gall bladder thinckening. HIDA scan with no evidence of acute or chronic cholecystitis.   PLAN: - Discussed lab results on 06/23/2024 in detail with patient: - Based on US  abd findings of gall bladder wall thickening- HIDA was done and shows patient cystic duct and normal EF. Overall not suggestive of cholecystitis. -abnormal LFTs could be from fatty liver. Less likely from Calquence  or CLL involvement of Liver. -continue Calquence  100mg  po BID with continued close monitoring. -if persistent/worsening LFT elevation -will need referral to hepatoloigy. Currently no new abdominal symptoms.  FOLLOW-UP in 6 weeks  for labs and follow-up with Dr. Onesimo.  The total time spent in the appointment was 20 minutes* .  All of the patient's questions were answered and the patient knows to call the clinic with any problems, questions, or concerns.  Emaline Onesimo MD MS AAHIVMS [facility] Hematology/Oncology Physician [facility]  *Total Encounter Time as defined by the Centers for Medicare and Medicaid Services includes, in addition to the face-to-face time of a patient visit (documented in the note above) non-face-to-face time: obtaining and reviewing outside history, ordering and reviewing medications, tests or procedures, care coordination (communications with other health care professionals or caregivers) and documentation in the medical record.  I, Alan Blowers, acting as a neurosurgeon for  Emaline Onesimo, MD.,have documented all relevant documentation on the behalf of Emaline Onesimo, MD,as directed by  Emaline Onesimo, MD while in the presence of Emaline Onesimo, MD.  I have reviewed the above documentation  for accuracy and completeness, and I agree with the above.  Emaline Saran, MD     [1]  Social History Tobacco Use   Smoking status: Never   Smokeless tobacco: Never  Vaping Use   Vaping status: Never Used  Substance Use Topics   Alcohol use: Not Currently   Drug use: No   "

## 2024-07-07 ENCOUNTER — Other Ambulatory Visit (HOSPITAL_COMMUNITY): Payer: Self-pay

## 2024-07-07 ENCOUNTER — Other Ambulatory Visit: Payer: Self-pay

## 2024-07-07 ENCOUNTER — Other Ambulatory Visit: Payer: Self-pay | Admitting: Hematology

## 2024-07-07 DIAGNOSIS — C9110 Chronic lymphocytic leukemia of B-cell type not having achieved remission: Secondary | ICD-10-CM

## 2024-07-07 MED ORDER — CALQUENCE 100 MG PO TABS
100.0000 mg | ORAL_TABLET | Freq: Two times a day (BID) | ORAL | 2 refills | Status: AC
  Filled 2024-07-07 – 2024-07-11 (×2): qty 60, 30d supply, fill #0

## 2024-07-10 ENCOUNTER — Encounter: Payer: Self-pay | Admitting: Hematology

## 2024-07-11 ENCOUNTER — Other Ambulatory Visit: Payer: Self-pay

## 2024-07-13 ENCOUNTER — Other Ambulatory Visit: Payer: Self-pay

## 2024-07-17 ENCOUNTER — Other Ambulatory Visit (HOSPITAL_COMMUNITY): Payer: Self-pay

## 2024-07-26 ENCOUNTER — Telehealth: Payer: Self-pay | Admitting: *Deleted

## 2024-07-26 NOTE — Telephone Encounter (Signed)
 Received [employer]  Verification.  No Copay, No PA

## 2024-07-28 ENCOUNTER — Ambulatory Visit: Admitting: Nurse Practitioner

## 2024-07-31 ENCOUNTER — Ambulatory Visit: Payer: Self-pay | Admitting: Nurse Practitioner

## 2024-08-03 ENCOUNTER — Other Ambulatory Visit: Payer: Self-pay

## 2024-08-14 ENCOUNTER — Other Ambulatory Visit (HOSPITAL_BASED_OUTPATIENT_CLINIC_OR_DEPARTMENT_OTHER)

## 2024-08-16 ENCOUNTER — Inpatient Hospital Stay: Attending: Hematology

## 2024-08-16 ENCOUNTER — Inpatient Hospital Stay: Admitting: Hematology

## 2024-09-01 ENCOUNTER — Ambulatory Visit: Admitting: Nurse Practitioner

## 2024-09-05 ENCOUNTER — Ambulatory Visit: Payer: Self-pay

## 2025-03-21 ENCOUNTER — Other Ambulatory Visit (HOSPITAL_BASED_OUTPATIENT_CLINIC_OR_DEPARTMENT_OTHER)

## 2025-06-15 ENCOUNTER — Ambulatory Visit: Admitting: Nurse Practitioner
